# Patient Record
Sex: Male | Born: 1985
Health system: Southern US, Community
[De-identification: ages and names within clinical notes are randomized; demographics above are authoritative.]

## PROBLEM LIST (undated history)

## (undated) DIAGNOSIS — F1020 Alcohol dependence, uncomplicated: Secondary | ICD-10-CM

## (undated) DIAGNOSIS — Z720 Tobacco use: Secondary | ICD-10-CM

---

## 2009-01-27 ENCOUNTER — Emergency Department (HOSPITAL_COMMUNITY): Admission: EM | Admit: 2009-01-27 | Discharge: 2009-01-27 | Payer: Self-pay | Admitting: Emergency Medicine

## 2013-09-11 ENCOUNTER — Emergency Department (INDEPENDENT_AMBULATORY_CARE_PROVIDER_SITE_OTHER)
Admission: EM | Admit: 2013-09-11 | Discharge: 2013-09-11 | Disposition: A | Discharge status: Home / Self Care | Payer: BC Managed Care – PPO | Source: Home / Self Care | Attending: Family Medicine | Admitting: Family Medicine

## 2013-09-11 ENCOUNTER — Encounter (HOSPITAL_COMMUNITY): Payer: Self-pay | Admitting: Emergency Medicine

## 2013-09-11 DIAGNOSIS — W57XXXA Bitten or stung by nonvenomous insect and other nonvenomous arthropods, initial encounter: Principal | ICD-10-CM

## 2013-09-11 DIAGNOSIS — S90569A Insect bite (nonvenomous), unspecified ankle, initial encounter: Secondary | ICD-10-CM

## 2013-09-11 DIAGNOSIS — S80862A Insect bite (nonvenomous), left lower leg, initial encounter: Secondary | ICD-10-CM

## 2013-09-11 MED ORDER — FLUTICASONE PROPIONATE 0.05 % EX CREA: TOPICAL_CREAM | Freq: Two times a day (BID) | CUTANEOUS | Status: DC

## 2013-09-11 NOTE — ED Provider Notes (Signed)
CSN: [id number]     Arrival date & time 09/11/13  1231 History   First MD Initiated Contact with Patient 09/11/13 1333     Chief Complaint  Patient presents with  . Insect Bite   (Consider location/radiation/quality/duration/timing/severity/associated sxs/prior Treatment) Patient is a 28 y.o. male presenting with rash. The history is provided by the patient.  Rash Location:  Leg Leg rash location:  L upper leg Quality: redness   Quality: not blistering, not itchy, not painful and not swelling   Severity:  Mild Onset quality:  Sudden Duration:  3 days Progression:  Spreading Chronicity:  New Context: insect bite/sting   Relieved by:  None tried Worsened by:  Nothing tried Associated symptoms: no fever     History reviewed. No pertinent past medical history. History reviewed. No pertinent past surgical history. No family history on file. History  Substance Use Topics  . Smoking status: Current Every Day Smoker -- 2.00 packs/day    Types: Cigarettes  . Smokeless tobacco: Not on file  . Alcohol Use: Yes    Review of Systems  Constitutional: Negative for fever.  Musculoskeletal: Negative.   Skin: Positive for rash. Negative for wound.    Allergies  Review of patient's allergies indicates no known allergies.  Home Medications   Prior to Admission medications   Medication Sig Start Date End Date Taking? Authorizing Provider  fluticasone (CUTIVATE) 0.05 % cream Apply topically 2 (two) times daily. 09/11/13   Linna HoffJames D London Tarnowski, MD   BP 130/87  Pulse 88  Temp(Src) 98.2 F (36.8 C) (Oral)  Resp 16  SpO2 99% Physical Exam  Nursing note and vitals reviewed. Constitutional: He is oriented to person, place, and time. He appears well-developed and well-nourished.  Musculoskeletal: Normal range of motion.  Neurological: He is alert and oriented to person, place, and time.  Skin: Skin is warm and dry. Rash noted. There is erythema. No pallor.  irreg erythematous rash to thigh,  sl central induration, nontender.    ED Course  Procedures (including critical care time) Labs Review Labs Reviewed - No data to display  Imaging Review No results found.   MDM   1. Insect bite of leg, left, initial encounter        Linna HoffJames D Louana Fontenot, MD 09/11/13 [phone]

## 2013-09-11 NOTE — Discharge Instructions (Signed)
Use cream to rash as needed. Return as needed.

## 2013-09-11 NOTE — ED Notes (Signed)
Pt c/o poss insect bite to left inner thigh onset 3 days Sx include redness that's getting bigger Denies pain, fevers Alert w/no signs of acute distress.

## 2015-01-09 ENCOUNTER — Encounter (HOSPITAL_COMMUNITY): Payer: Self-pay | Admitting: *Deleted

## 2015-01-09 ENCOUNTER — Emergency Department (INDEPENDENT_AMBULATORY_CARE_PROVIDER_SITE_OTHER)
Admission: EM | Admit: 2015-01-09 | Discharge: 2015-01-09 | Disposition: A | Discharge status: Home / Self Care | Payer: Self-pay | Source: Home / Self Care | Attending: Family Medicine | Admitting: Family Medicine

## 2015-01-09 DIAGNOSIS — K529 Noninfective gastroenteritis and colitis, unspecified: Secondary | ICD-10-CM

## 2015-01-09 MED ORDER — ONDANSETRON HCL 4 MG PO TABS: 4.0000 mg | ORAL_TABLET | Freq: Four times a day (QID) | ORAL | Status: DC

## 2015-01-09 MED ORDER — ONDANSETRON 4 MG PO TBDP
ORAL_TABLET | ORAL | Status: AC
  Filled 2015-01-09: qty 1

## 2015-01-09 MED ORDER — ONDANSETRON 4 MG PO TBDP
4.0000 mg | ORAL_TABLET | Freq: Once | ORAL | Status: AC
  Administered 2015-01-09: 4 mg via ORAL

## 2015-01-09 NOTE — ED Provider Notes (Signed)
CSN: [id number]     Arrival date & time 01/09/15  1305 History   First MD Initiated Contact with Patient 01/09/15 1342     Chief Complaint  Patient presents with  . Emesis   (Consider location/radiation/quality/duration/timing/severity/associated sxs/prior Treatment) Patient is a 29 y.o. male presenting with vomiting. The history is provided by the patient.  Emesis Severity:  Moderate Duration:  4 hours Number of daily episodes:  10 Quality:  Stomach contents Progression:  Improving Chronicity:  New Context comment:  Ate at [employer] last eve after work and began vomiting this am on awakening. Relieved by:  None tried Worsened by:  Nothing tried Ineffective treatments:  Liquids Associated symptoms: no abdominal pain, no diarrhea and no fever   Risk factors: suspect food intake     History reviewed. No pertinent past medical history. History reviewed. No pertinent past surgical history. History reviewed. No pertinent family history. Social History  Substance Use Topics  . Smoking status: Current Every Day Smoker -- 2.00 packs/day    Types: Cigarettes  . Smokeless tobacco: None  . Alcohol Use: Yes    Review of Systems  Constitutional: Negative.   Gastrointestinal: Positive for nausea and vomiting. Negative for abdominal pain, diarrhea and constipation.  Skin: Negative.   All other systems reviewed and are negative.   Allergies  Review of patient's allergies indicates no known allergies.  Home Medications   Prior to Admission medications   Medication Sig Start Date End Date Taking? Authorizing Provider  fluticasone (CUTIVATE) 0.05 % cream Apply topically 2 (two) times daily. 09/11/13   Linna HoffJames D Martha Ellerby, MD  ondansetron (ZOFRAN) 4 MG tablet Take 1 tablet (4 mg total) by mouth every 6 (six) hours. Prn n/v. 01/09/15   Linna HoffJames D Noele Icenhour, MD   Meds Ordered and Administered this Visit   Medications  ondansetron (ZOFRAN-ODT) disintegrating tablet 4 mg (not administered)    BP  141/98 mmHg  Pulse 97  Temp(Src) 98.9 F (37.2 C) (Oral)  Resp 16  SpO2 98% No data found.   Physical Exam  Constitutional: He is oriented to person, place, and time. He appears well-developed and well-nourished. No distress.  HENT:  Head: Normocephalic.  Mouth/Throat: Oropharynx is clear and moist.  Neck: Normal range of motion. Neck supple.  Pulmonary/Chest: Effort normal and breath sounds normal.  Abdominal: Soft. Bowel sounds are normal. He exhibits no distension and no mass. There is no tenderness. There is no rebound and no guarding.  Lymphadenopathy:    He has no cervical adenopathy.  Neurological: He is alert and oriented to person, place, and time.  Skin: Skin is warm and dry.  Nursing note and vitals reviewed.   ED Course  Procedures (including critical care time)  Labs Review Labs Reviewed - No data to display  Imaging Review No results found.   Visual Acuity Review  Right Eye Distance:   Left Eye Distance:   Bilateral Distance:    Right Eye Near:   Left Eye Near:    Bilateral Near:         MDM   1. Gastroenteritis, acute        Linna HoffJames D Cabell Lazenby, MD 01/09/15 1432

## 2015-01-09 NOTE — ED Notes (Signed)
Pt  Reports   Symptoms     Of    Vomiting     Woke  Up  With  The   Symptoms   This  Am      No  Diarrhea      Pt    Reports  abd  Pain

## 2015-01-09 NOTE — Discharge Instructions (Signed)
Clear liquid , bland diet tonight as tolerated, advance on tues as improved, use medicine as needed, return or see your doctor if any problems. °

## 2018-08-10 ENCOUNTER — Other Ambulatory Visit: Payer: Self-pay

## 2018-08-10 ENCOUNTER — Emergency Department (HOSPITAL_COMMUNITY): Payer: Self-pay

## 2018-08-10 ENCOUNTER — Inpatient Hospital Stay (HOSPITAL_COMMUNITY)
Admission: EM | Admit: 2018-08-10 | Discharge: 2018-08-12 | DRG: 440 | Disposition: A | Discharge status: Home / Self Care | Payer: Self-pay | Attending: Internal Medicine | Admitting: Internal Medicine

## 2018-08-10 ENCOUNTER — Encounter (HOSPITAL_COMMUNITY): Payer: Self-pay | Admitting: Emergency Medicine

## 2018-08-10 DIAGNOSIS — Z720 Tobacco use: Secondary | ICD-10-CM

## 2018-08-10 DIAGNOSIS — Z7141 Alcohol abuse counseling and surveillance of alcoholic: Secondary | ICD-10-CM

## 2018-08-10 DIAGNOSIS — F1290 Cannabis use, unspecified, uncomplicated: Secondary | ICD-10-CM | POA: Diagnosis present

## 2018-08-10 DIAGNOSIS — Z716 Tobacco abuse counseling: Secondary | ICD-10-CM

## 2018-08-10 DIAGNOSIS — Z8249 Family history of ischemic heart disease and other diseases of the circulatory system: Secondary | ICD-10-CM

## 2018-08-10 DIAGNOSIS — K859 Acute pancreatitis without necrosis or infection, unspecified: Secondary | ICD-10-CM | POA: Diagnosis present

## 2018-08-10 DIAGNOSIS — R81 Glycosuria: Secondary | ICD-10-CM | POA: Diagnosis present

## 2018-08-10 DIAGNOSIS — Z79899 Other long term (current) drug therapy: Secondary | ICD-10-CM

## 2018-08-10 DIAGNOSIS — R739 Hyperglycemia, unspecified: Secondary | ICD-10-CM | POA: Diagnosis present

## 2018-08-10 DIAGNOSIS — F1190 Opioid use, unspecified, uncomplicated: Secondary | ICD-10-CM | POA: Diagnosis present

## 2018-08-10 DIAGNOSIS — F1020 Alcohol dependence, uncomplicated: Secondary | ICD-10-CM | POA: Diagnosis present

## 2018-08-10 DIAGNOSIS — Z20828 Contact with and (suspected) exposure to other viral communicable diseases: Secondary | ICD-10-CM | POA: Diagnosis present

## 2018-08-10 DIAGNOSIS — K852 Alcohol induced acute pancreatitis without necrosis or infection: Principal | ICD-10-CM

## 2018-08-10 HISTORY — DX: Tobacco use: Z72.0

## 2018-08-10 HISTORY — DX: Alcohol dependence, uncomplicated: F10.20

## 2018-08-10 LAB — URINALYSIS, ROUTINE W REFLEX MICROSCOPIC
Bilirubin Urine: NEGATIVE
Glucose, UA: 150 mg/dL — AB
Hgb urine dipstick: NEGATIVE
Ketones, ur: NEGATIVE mg/dL
Leukocytes,Ua: NEGATIVE
Nitrite: NEGATIVE
Protein, ur: NEGATIVE mg/dL
Specific Gravity, Urine: 1.011 (ref 1.005–1.030)
pH: 5 (ref 5.0–8.0)

## 2018-08-10 LAB — COMPREHENSIVE METABOLIC PANEL
ALT: 45 U/L — ABNORMAL HIGH (ref 0–44)
AST: 90 U/L — ABNORMAL HIGH (ref 15–41)
Albumin: 3.9 g/dL (ref 3.5–5.0)
Alkaline Phosphatase: 70 U/L (ref 38–126)
Anion gap: 15 (ref 5–15)
BUN: 7 mg/dL (ref 6–20)
CO2: 20 mmol/L — ABNORMAL LOW (ref 22–32)
Calcium: 9.1 mg/dL (ref 8.9–10.3)
Chloride: 104 mmol/L (ref 98–111)
Creatinine, Ser: 0.89 mg/dL (ref 0.61–1.24)
GFR calc Af Amer: >60 mL/min (ref >60)
GFR calc non Af Amer: >60 mL/min (ref >60)
Glucose, Bld: 144 mg/dL — ABNORMAL HIGH (ref 70–99)
Potassium: 3.5 mmol/L (ref 3.5–5.1)
Sodium: 139 mmol/L (ref 135–145)
Total Bilirubin: 0.6 mg/dL (ref 0.3–1.2)
Total Protein: 7.2 g/dL (ref 6.5–8.1)

## 2018-08-10 LAB — CBC
HCT: 46.6 % (ref 39.0–52.0)
Hemoglobin: 15.8 g/dL (ref 13.0–17.0)
MCH: 32 pg (ref 26.0–34.0)
MCHC: 33.9 g/dL (ref 30.0–36.0)
MCV: 94.3 fL (ref 80.0–100.0)
Platelets: 232 10*3/uL (ref 150–400)
RBC: 4.94 MIL/uL (ref 4.22–5.81)
RDW: 12.7 % (ref 11.5–15.5)
WBC: 12.3 10*3/uL — ABNORMAL HIGH (ref 4.0–10.5)
nRBC: 0 % (ref 0.0–0.2)

## 2018-08-10 LAB — PHOSPHORUS: Phosphorus: 3.7 mg/dL (ref 2.5–4.6)

## 2018-08-10 LAB — SARS CORONAVIRUS 2 BY RT PCR (HOSPITAL ORDER, PERFORMED IN ~~LOC~~ HOSPITAL LAB): SARS Coronavirus 2: NEGATIVE

## 2018-08-10 LAB — RAPID URINE DRUG SCREEN, HOSP PERFORMED
Amphetamines: NOT DETECTED
Barbiturates: NOT DETECTED
Benzodiazepines: NOT DETECTED
Cocaine: NOT DETECTED
Opiates: POSITIVE — AB
Tetrahydrocannabinol: POSITIVE — AB

## 2018-08-10 LAB — LIPASE, BLOOD: Lipase: 2804 U/L — ABNORMAL HIGH (ref 11–51)

## 2018-08-10 LAB — MAGNESIUM: Magnesium: 1.7 mg/dL (ref 1.7–2.4)

## 2018-08-10 MED ORDER — ONDANSETRON HCL 4 MG/2ML IJ SOLN
4.0000 mg | Freq: Once | INTRAMUSCULAR | Status: AC
  Administered 2018-08-10: 4 mg via INTRAVENOUS
  Filled 2018-08-10: qty 2

## 2018-08-10 MED ORDER — TEMAZEPAM 15 MG PO CAPS
30.0000 mg | ORAL_CAPSULE | Freq: Every day | ORAL | Status: DC
  Filled 2018-08-10: qty 2

## 2018-08-10 MED ORDER — ENOXAPARIN SODIUM 40 MG/0.4ML ~~LOC~~ SOLN: 40.0000 mg | SUBCUTANEOUS | Status: DC

## 2018-08-10 MED ORDER — LORAZEPAM 2 MG/ML IJ SOLN
1.0000 mg | Freq: Once | INTRAMUSCULAR | Status: AC
  Administered 2018-08-10: 1 mg via INTRAVENOUS
  Filled 2018-08-10: qty 1

## 2018-08-10 MED ORDER — POTASSIUM CHLORIDE IN NACL 20-0.9 MEQ/L-% IV SOLN
INTRAVENOUS | Status: DC
  Administered 2018-08-10 – 2018-08-12 (×5): via INTRAVENOUS
  Filled 2018-08-10 (×7): qty 1000

## 2018-08-10 MED ORDER — HYDROMORPHONE HCL 1 MG/ML IJ SOLN
1.0000 mg | INTRAMUSCULAR | Status: DC | PRN
  Administered 2018-08-10 – 2018-08-12 (×11): 1 mg via INTRAVENOUS
  Filled 2018-08-10 (×11): qty 1

## 2018-08-10 MED ORDER — ACETAMINOPHEN 325 MG PO TABS: 650.0000 mg | ORAL_TABLET | Freq: Four times a day (QID) | ORAL | Status: DC | PRN

## 2018-08-10 MED ORDER — HYDROMORPHONE HCL 1 MG/ML IJ SOLN
1.0000 mg | Freq: Once | INTRAMUSCULAR | Status: AC
  Administered 2018-08-10: 1 mg via INTRAVENOUS
  Filled 2018-08-10: qty 1

## 2018-08-10 MED ORDER — MAGNESIUM SULFATE 2 GM/50ML IV SOLN
2.0000 g | Freq: Once | INTRAVENOUS | Status: AC
  Administered 2018-08-10: 2 g via INTRAVENOUS
  Filled 2018-08-10: qty 50

## 2018-08-10 MED ORDER — LORAZEPAM 2 MG/ML IJ SOLN
2.0000 mg | INTRAMUSCULAR | Status: DC | PRN
  Administered 2018-08-10 – 2018-08-11 (×6): 2 mg via INTRAVENOUS
  Filled 2018-08-10 (×6): qty 1

## 2018-08-10 MED ORDER — ACETAMINOPHEN 650 MG RE SUPP: 650.0000 mg | Freq: Four times a day (QID) | RECTAL | Status: DC | PRN

## 2018-08-10 MED ORDER — IOHEXOL 300 MG/ML  SOLN
100.0000 mL | Freq: Once | INTRAMUSCULAR | Status: AC | PRN
  Administered 2018-08-10: 100 mL via INTRAVENOUS

## 2018-08-10 MED ORDER — FENTANYL CITRATE (PF) 100 MCG/2ML IJ SOLN
50.0000 ug | Freq: Once | INTRAMUSCULAR | Status: AC
  Administered 2018-08-10: 50 ug via INTRAVENOUS
  Filled 2018-08-10: qty 2

## 2018-08-10 MED ORDER — ONDANSETRON HCL 4 MG PO TABS: 4.0000 mg | ORAL_TABLET | Freq: Four times a day (QID) | ORAL | Status: DC | PRN

## 2018-08-10 MED ORDER — PANTOPRAZOLE SODIUM 40 MG IV SOLR
40.0000 mg | INTRAVENOUS | Status: DC
  Administered 2018-08-10 – 2018-08-12 (×3): 40 mg via INTRAVENOUS
  Filled 2018-08-10 (×3): qty 40

## 2018-08-10 MED ORDER — SODIUM CHLORIDE 0.9 % IV BOLUS
1000.0000 mL | Freq: Once | INTRAVENOUS | Status: AC
  Administered 2018-08-10: 1000 mL via INTRAVENOUS

## 2018-08-10 MED ORDER — ONDANSETRON HCL 4 MG/2ML IJ SOLN: 4.0000 mg | Freq: Four times a day (QID) | INTRAMUSCULAR | Status: DC | PRN

## 2018-08-10 MED ORDER — FAMOTIDINE IN NACL 20-0.9 MG/50ML-% IV SOLN
20.0000 mg | Freq: Two times a day (BID) | INTRAVENOUS | Status: DC
  Filled 2018-08-10 (×2): qty 50

## 2018-08-10 NOTE — ED Notes (Signed)
Isaac Alvarado, patient's mother wanted to leave contact number so that she can be updated on plan of care for patient  [phone]

## 2018-08-10 NOTE — ED Triage Notes (Addendum)
Patient presents diaphoretic c/o mid upper abdominal pain with sudden onset this am. Patient states one episode of emesis, reports a bad gag reflux. Patient pacing in room.

## 2018-08-10 NOTE — Progress Notes (Signed)
Isaac Alvarado [id number] Admission Data: 08/10/2018 5:22 PM Attending Provider: Bobette Mo, MD  VEL:[id number], No Pcp Per Consults/ Treatment Team:   Isaac Alvarado is a 33 y.o. male patient admitted from ED awake, alert  & orientated  X 3,  Full Code, VSS - Blood pressure (!) 156/111, pulse (!) 124, temperature 97.7 F (36.5 C), resp. rate 17, height 5\' 11"  (1.803 m), weight 70.3 kg, SpO2 98 %., O2    RA, no c/o shortness of breath, no c/o chest pain, no distress noted. Tele # 24 placed and pt is currently running:normal sinus rhythm, sinus tachycardia.   IV site WDL:  wrist left, condition patent and no redness with a transparent dsg that's clean dry and intact.  Allergies:  No Known Allergies   Past Medical History:  Diagnosis Date  . Alcohol dependence (HCC)   . Tobacco use     History:  obtained from chart review and the patient. Tobacco/alcohol: Patient stated smokes 2 pack per day. History of alcohol abuse. Patient stated last drinking yesterday 6 pack of beer.   Pt orientation to unit, room and routine. Information packet given to patient/family and safety video watched.  Admission INP armband ID verified with patient/family, and in place. SR up x 2, fall risk assessment complete with Patient and family verbalizing understanding of risks associated with falls. Pt verbalizes an understanding of how to use the call bell and to call for help before getting out of bed.  Skin, clean-dry- intact without evidence of bruising, or skin tears.   No evidence of skin break down noted on exam. no rashes, no ecchymoses, no petechiae, no nodules, no jaundice, no purpura, no wounds    Will cont to monitor and assist as needed.  Theodore Demark, RN 08/10/2018 5:22 PM

## 2018-08-10 NOTE — ED Notes (Signed)
ED TO INPATIENT HANDOFF REPORT  ED Nurse Name and Phone #: Florentina AddisonKatie [phone]  S Name/Age/Gender Isaac Alvarado 33 y.o. male Room/Bed: 025C/025C  Code Status   Code Status: Not on file  Home/SNF/Other Home Patient oriented to: self, place, time and situation Is this baseline? Yes   Triage Complete: Triage complete  Chief Complaint abd pain/nausea  Triage Note Patient presents diaphoretic c/o mid upper abdominal pain with sudden onset this am. Patient states one episode of emesis, reports a bad gag reflux. Patient pacing in room.   Allergies No Known Allergies  Level of Care/Admitting Diagnosis ED Disposition    ED Disposition Condition Comment   Admit  The patient appears reasonably stabilized for admission considering the current resources, flow, and capabilities available in the ED at this time, and I doubt any other [facility] requiring further screening and/or treatment in the ED prior to admission is  present.       B Medical/Surgery History History reviewed. No pertinent past medical history. History reviewed. No pertinent surgical history.   A IV Location/Drains/Wounds Patient Lines/Drains/Airways Status   Active Line/Drains/Airways    Name:   Placement date:   Placement time:   Site:   Days:   Peripheral IV 08/10/18 Left Wrist   08/10/18    0921    Wrist   less than 1          Intake/Output Last 24 hours  Intake/Output Summary (Last 24 hours) at 08/10/2018 1223 Last data filed at 08/10/2018 1215 Gross per 24 hour  Intake 1000 ml  Output -  Net 1000 ml    Labs/Imaging Results for orders placed or performed during the hospital encounter of 08/10/18 (from the past 48 hour(s))  Lipase, blood     Status: Abnormal   Collection Time: 08/10/18  9:05 AM  Result Value Ref Range   Lipase 2,804 (H) 11 - 51 U/L    Comment: RESULTS CONFIRMED BY MANUAL DILUTION Performed at [facility], [address] [place], [place] [address]   Comprehensive metabolic panel      Status: Abnormal   Collection Time: 08/10/18  9:05 AM  Result Value Ref Range   Sodium 139 135 - 145 mmol/L   Potassium 3.5 3.5 - 5.1 mmol/L   Chloride 104 98 - 111 mmol/L   CO2 20 (L) 22 - 32 mmol/L   Glucose, Bld 144 (H) 70 - 99 mg/dL   BUN 7 6 - 20 mg/dL   Creatinine, Ser 1.470.89 0.61 - 1.24 mg/dL   Calcium 9.1 8.9 - 82.910.3 mg/dL   Total Protein 7.2 6.5 - 8.1 g/dL   Albumin 3.9 3.5 - 5.0 g/dL   AST 90 (H) 15 - 41 U/L   ALT 45 (H) 0 - 44 U/L   Alkaline Phosphatase 70 38 - 126 U/L   Total Bilirubin 0.6 0.3 - 1.2 mg/dL   GFR calc non Af Amer >60 >60 mL/min   GFR calc Af Amer >60 >60 mL/min   Anion gap 15 5 - 15    Comment: Performed at [facility], [address] [place], [place] [address]  CBC     Status: Abnormal   Collection Time: 08/10/18  9:05 AM  Result Value Ref Range   WBC 12.3 (H) 4.0 - 10.5 K/uL   RBC 4.94 4.22 - 5.81 MIL/uL   Hemoglobin 15.8 13.0 - 17.0 g/dL   HCT 08.646.6 57.839.0 - 46.952.0 %   MCV 94.3 80.0 - 100.0 fL  MCH 32.0 26.0 - 34.0 pg   MCHC 33.9 30.0 - 36.0 g/dL   RDW 53.2 02.3 - 34.3 %   Platelets 232 150 - 400 K/uL   nRBC 0.0 0.0 - 0.2 %    Comment: Performed at [facility], [address], [place], [place] [address]  Urinalysis, Routine w reflex microscopic     Status: Abnormal   Collection Time: 08/10/18 10:25 AM  Result Value Ref Range   Color, Urine YELLOW YELLOW   APPearance CLEAR CLEAR   Specific Gravity, Urine 1.011 1.005 - 1.030   pH 5.0 5.0 - 8.0   Glucose, UA 150 (A) NEGATIVE mg/dL   Hgb urine dipstick NEGATIVE NEGATIVE   Bilirubin Urine NEGATIVE NEGATIVE   Ketones, ur NEGATIVE NEGATIVE mg/dL   Protein, ur NEGATIVE NEGATIVE mg/dL   Nitrite NEGATIVE NEGATIVE   Leukocytes,Ua NEGATIVE NEGATIVE    Comment: Performed at [facility], [address], [place], [place] [address]  Rapid urine drug screen (hospital performed)     Status: Abnormal   Collection Time: 08/10/18 10:25 AM  Result Value Ref Range   Opiates POSITIVE (A)  NONE DETECTED   Cocaine NONE DETECTED NONE DETECTED   Benzodiazepines NONE DETECTED NONE DETECTED   Amphetamines NONE DETECTED NONE DETECTED   Tetrahydrocannabinol POSITIVE (A) NONE DETECTED   Barbiturates NONE DETECTED NONE DETECTED    Comment: (NOTE) DRUG SCREEN FOR MEDICAL PURPOSES ONLY.  IF CONFIRMATION IS NEEDED FOR ANY PURPOSE, NOTIFY LAB WITHIN 5 DAYS. LOWEST DETECTABLE LIMITS FOR URINE DRUG SCREEN Drug Class                     Cutoff (ng/mL) Amphetamine and metabolites    1000 Barbiturate and metabolites    200 Benzodiazepine                 200 Tricyclics and metabolites     300 Opiates and metabolites        300 Cocaine and metabolites        300 THC                            50 Performed at [facility], [address], [place], [place] [address]    Ct Abdomen Pelvis W Contrast  Result Date: 08/10/2018 CLINICAL DATA:  Diffuse abdominal pain, nausea, unable to lie on back EXAM: CT ABDOMEN AND PELVIS WITH CONTRAST TECHNIQUE: Multidetector CT imaging of the abdomen and pelvis was performed using the standard protocol following bolus administration of intravenous contrast. CONTRAST:  OMNIPAQUE IOHEXOL 300 MG/ML  SOLN COMPARISON:  None. FINDINGS: Lower chest: No acute abnormality. Hepatobiliary: No focal liver abnormality is seen. No gallstones, gallbladder wall thickening, or biliary dilatation. Pancreas: There is severe, diffuse inflammatory fat stranding and fluid about the pancreas and adjacent retroperitoneum and bowel mesentery. No evidence of discrete fluid collection. No pancreatic ductal dilatation. Spleen: Normal in size without focal abnormality. Adrenals/Urinary Tract: Adrenal glands are unremarkable. Kidneys are normal, without renal calculi, focal lesion, or hydronephrosis. Bladder is unremarkable. Stomach/Bowel: Stomach is within normal limits. Appendix appears normal. No evidence of bowel wall thickening, distention, or inflammatory changes.  Vascular/Lymphatic: No significant vascular findings are present. No enlarged abdominal or pelvic lymph nodes. Reproductive: No mass or other abnormality. Other: No abdominal wall hernia or abnormality.  Minimal ascites. Musculoskeletal: No acute or significant osseous findings. IMPRESSION: 1. There is severe, diffuse inflammatory fat  stranding and fluid about the pancreas and adjacent retroperitoneum and bowel mesentery. Findings are consistent with acute pancreatitis. No evidence of discrete fluid collection. No pancreatic ductal dilatation. 2.  Minimal ascites. Electronically Signed   By: Lauralyn Primes M.D.   On: 08/10/2018 11:35   Dg Chest Port 1 View  Result Date: 08/10/2018 CLINICAL DATA:  Pain EXAM: PORTABLE CHEST 1 VIEW COMPARISON:  None. FINDINGS: Lungs are clear. Heart size and pulmonary vascularity are normal. No adenopathy. No pneumothorax. No bone lesions. IMPRESSION: No edema or consolidation. Electronically Signed   By: Bretta Bang III M.D.   On: 08/10/2018 10:00    Pending Labs Unresulted Labs (From admission, onward)    Start     Ordered   08/10/18 1155  SARS Coronavirus 2 (CEPHEID - Performed in [facility] hospital lab), [facility] Order  (Asymptomatic Patients Labs)  Once,   R    Question:  Rule Out  Answer:  Yes   08/10/18 1154          Vitals/Pain Today's Vitals   08/10/18 0928 08/10/18 1021 08/10/18 1026 08/10/18 1215  BP: (!) 161/113  (!) 160/105 (!) 149/95  Pulse: 89  90 95  Resp: 18  (!) 22 17  Temp:      TempSrc:      SpO2: 99%  99% 94%  Weight:      Height:      PainSc:  10-Worst pain ever  10-Worst pain ever    Isolation Precautions No active isolations  Medications Medications  sodium chloride 0.9 % bolus 1,000 mL (0 mLs Intravenous Stopped 08/10/18 1215)  HYDROmorphone (DILAUDID) injection 1 mg (1 mg Intravenous Given 08/10/18 0922)  ondansetron (ZOFRAN) injection 4 mg (4 mg Intravenous Given 08/10/18 0922)  iohexol (OMNIPAQUE) 300 MG/ML solution 100 mL  (100 mLs Intravenous Contrast Given 08/10/18 1106)  LORazepam (ATIVAN) injection 1 mg (1 mg Intravenous Given 08/10/18 1129)  fentaNYL (SUBLIMAZE) injection 50 mcg (50 mcg Intravenous Given 08/10/18 1213)    Mobility walks Low fall risk   Focused Assessments GI   R Recommendations: See Admitting Provider Note  Report given to:   Additional Notes:

## 2018-08-10 NOTE — ED Notes (Signed)
Walked patient to the bathroom patient did well 

## 2018-08-10 NOTE — ED Provider Notes (Signed)
[facility] EMERGENCY DEPARTMENT Provider Note   CSN: [id number] Arrival date & time: 08/10/18  0841    History   Chief Complaint Chief Complaint  Patient presents with   Abdominal Pain    HPI Isaac Alvarado is a 33 y.o. male.     33 year old male presents with complaint of epigastric abdominal pain onset 730 this morning.  Patient states that he woke up this morning without any pain, went to the bathroom and voided and then developed abdominal discomfort.  Patient went to the bathroom for a bowel movement hoping this would relieve his discomfort without success.  Patient states that every morning when he wakes up he coughs very hard and vomits and also gets very clammy when this happens.  Patient reports he had this today as well.  Abdominal pain is located the epigastric area described as cramping or having to multiple sit ups, is worse with taking a deep breath, does not radiate.  Denies history of stomach ulcers, denies any significant past medical history.  No prior abdominal surgeries.  No other complaints or concerns.     History reviewed. No pertinent past medical history.  Patient Active Problem List   Diagnosis Date Noted   Acute pancreatitis 08/10/2018    History reviewed. No pertinent surgical history.      Home Medications    Prior to Admission medications   Medication Sig Start Date End Date Taking? Authorizing Provider  diphenhydrAMINE (BENADRYL) 25 mg capsule Take 25 mg by mouth daily at 12 noon.   Yes [provider]  omeprazole (PRILOSEC OTC) 20 MG tablet Take 20 mg by mouth daily at 12 noon.   Yes [provider]  fluticasone (CUTIVATE) 0.05 % cream Apply topically 2 (two) times daily. Patient not taking: Reported on 08/10/2018 09/11/13   Billy Fischer, MD  ondansetron (ZOFRAN) 4 MG tablet Take 1 tablet (4 mg total) by mouth every 6 (six) hours. Prn n/v. Patient not taking: Reported on 08/10/2018 01/09/15   Billy Fischer,  MD    Family History History reviewed. No pertinent family history.  Social History Social History   Tobacco Use   Smoking status: Current Every Day Smoker    Packs/day: 2.00    Types: Cigarettes   Smokeless tobacco: Never Used  Substance Use Topics   Alcohol use: Yes   Drug use: Yes    Types: Marijuana     Allergies   Patient has no known allergies.   Review of Systems Review of Systems  Constitutional: Positive for diaphoresis. Negative for chills and fever.  Respiratory: Positive for cough.   Cardiovascular: Negative for chest pain.  Gastrointestinal: Positive for abdominal pain and vomiting. Negative for blood in stool, constipation, diarrhea and nausea.  Genitourinary: Negative for dysuria, frequency and urgency.  Musculoskeletal: Negative for arthralgias and myalgias.  Skin: Negative for rash and wound.  Allergic/Immunologic: Negative for immunocompromised state.  Neurological: Negative for dizziness and weakness.  Hematological: Negative for adenopathy. Does not bruise/bleed easily.  All other systems reviewed and are negative.    Physical Exam Updated Vital Signs BP (!) 149/95 (BP Location: Right Arm)    Pulse 95    Temp (!) 97.3 F (36.3 C) (Oral)    Resp 17    Ht 5' 11"  (1.803 m)    Wt 70.3 kg    SpO2 94%    BMI 21.62 kg/m   Physical Exam Vitals signs and nursing note reviewed.  Constitutional:  General: He is not in acute distress.    Appearance: He is well-developed. He is diaphoretic.     Comments: Appears uncomfortable   HENT:     Head: Normocephalic and atraumatic.  Cardiovascular:     Rate and Rhythm: Normal rate and regular rhythm.     Heart sounds: Normal heart sounds.  Pulmonary:     Effort: Pulmonary effort is normal.     Breath sounds: Normal breath sounds.  Abdominal:     General: Abdomen is flat.     Tenderness: There is abdominal tenderness in the epigastric area. There is guarding.     Hernia: No hernia is present.    Musculoskeletal:     Comments: DP and femoral pulses present and equal.  Skin:    General: Skin is warm.  Neurological:     Mental Status: He is alert and oriented to person, place, and time.  Psychiatric:        Behavior: Behavior normal.      ED Treatments / Results  Labs (all labs ordered are listed, but only abnormal results are displayed) Labs Reviewed  LIPASE, BLOOD - Abnormal; Notable for the following components:      Result Value   Lipase 2,804 (*)    All other components within normal limits  COMPREHENSIVE METABOLIC PANEL - Abnormal; Notable for the following components:   CO2 20 (*)    Glucose, Bld 144 (*)    AST 90 (*)    ALT 45 (*)    All other components within normal limits  CBC - Abnormal; Notable for the following components:   WBC 12.3 (*)    All other components within normal limits  URINALYSIS, ROUTINE W REFLEX MICROSCOPIC - Abnormal; Notable for the following components:   Glucose, UA 150 (*)    All other components within normal limits  RAPID URINE DRUG SCREEN, HOSP PERFORMED - Abnormal; Notable for the following components:   Opiates POSITIVE (*)    Tetrahydrocannabinol POSITIVE (*)    All other components within normal limits  SARS CORONAVIRUS 2 (HOSPITAL ORDER, [facility])    EKG None  Radiology Ct Abdomen Pelvis W Contrast  Result Date: 08/10/2018 CLINICAL DATA:  Diffuse abdominal pain, nausea, unable to lie on back EXAM: CT ABDOMEN AND PELVIS WITH CONTRAST TECHNIQUE: Multidetector CT imaging of the abdomen and pelvis was performed using the standard protocol following bolus administration of intravenous contrast. CONTRAST:  1108m OMNIPAQUE IOHEXOL 300 MG/ML  SOLN COMPARISON:  None. FINDINGS: Lower chest: No acute abnormality. Hepatobiliary: No focal liver abnormality is seen. No gallstones, gallbladder wall thickening, or biliary dilatation. Pancreas: There is severe, diffuse inflammatory fat stranding and fluid about  the pancreas and adjacent retroperitoneum and bowel mesentery. No evidence of discrete fluid collection. No pancreatic ductal dilatation. Spleen: Normal in size without focal abnormality. Adrenals/Urinary Tract: Adrenal glands are unremarkable. Kidneys are normal, without renal calculi, focal lesion, or hydronephrosis. Bladder is unremarkable. Stomach/Bowel: Stomach is within normal limits. Appendix appears normal. No evidence of bowel wall thickening, distention, or inflammatory changes. Vascular/Lymphatic: No significant vascular findings are present. No enlarged abdominal or pelvic lymph nodes. Reproductive: No mass or other abnormality. Other: No abdominal wall hernia or abnormality.  Minimal ascites. Musculoskeletal: No acute or significant osseous findings. IMPRESSION: 1. There is severe, diffuse inflammatory fat stranding and fluid about the pancreas and adjacent retroperitoneum and bowel mesentery. Findings are consistent with acute pancreatitis. No evidence of discrete fluid collection. No pancreatic ductal  dilatation. 2.  Minimal ascites. Electronically Signed   By: Eddie Candle M.D.   On: 08/10/2018 11:35   Dg Chest Port 1 View  Result Date: 08/10/2018 CLINICAL DATA:  Pain EXAM: PORTABLE CHEST 1 VIEW COMPARISON:  None. FINDINGS: Lungs are clear. Heart size and pulmonary vascularity are normal. No adenopathy. No pneumothorax. No bone lesions. IMPRESSION: No edema or consolidation. Electronically Signed   By: Lowella Grip III M.D.   On: 08/10/2018 10:00    Procedures Procedures (including critical care time)  Medications Ordered in ED Medications  sodium chloride 0.9 % bolus 1,000 mL (0 mLs Intravenous Stopped 08/10/18 1215)  HYDROmorphone (DILAUDID) injection 1 mg (1 mg Intravenous Given 08/10/18 0922)  ondansetron (ZOFRAN) injection 4 mg (4 mg Intravenous Given 08/10/18 0922)  iohexol (OMNIPAQUE) 300 MG/ML solution 100 mL (100 mLs Intravenous Contrast Given 08/10/18 1106)  LORazepam (ATIVAN)  injection 1 mg (1 mg Intravenous Given 08/10/18 1129)  fentaNYL (SUBLIMAZE) injection 50 mcg (50 mcg Intravenous Given 08/10/18 1213)     Initial Impression / Assessment and Plan / ED Course  I have reviewed the triage vital signs and the nursing notes.  Pertinent labs & imaging results that were available during my care of the patient were reviewed by me and considered in my medical decision making (see chart for details).  Clinical Course as of Aug 09 1232  Mon Aug 09, 3273  3315 33 year old male presents with complaint of sudden onset of severe epigastric abdominal pain this morning with vomiting and diaphoresis.  Exam patient appears very uncomfortable, unable to lie still, diaphoretic with epigastric abdominal tenderness.   Patient denies PMH, admits to drinking heavily for the past few days. Patient was given 1 of Dilaudid and 4 of Zofran, sent for CT scan however was unable to lie flat for his CT scan and was given Ativan.  Patient reports very slight improvement in his pain, no longer vomiting.  CT scan positive for pancreatitis without abscess.  Review of lab work, lipase 2804, WBC 12.3.  Urine drug screen positive for opiates and marijuana.  Urinalysis positive for glucose.  CMP with CO2 of 20, elevated AST and ALT at 90 and 45, alk phos normal, total bili normal. Case discussed with Dr. Johnney Killian, ER attending, agrees with plan of care- consult hospitalist for admission.   [LM]  1234 Hospitalist will consult for admission.    [LM]    Clinical Course User Index [LM] Tacy Learn, PA-C      Final Clinical Impressions(s) / ED Diagnoses   Final diagnoses:  Alcohol-induced acute pancreatitis, unspecified complication status    ED Discharge Orders    None       Jaycen, Vercher, PA-C 08/10/18 1234    Charlesetta Shanks, MD 08/17/18 1443

## 2018-08-10 NOTE — H&P (Signed)
History and Physical    Isaac Alvarado ZOX:[id number] DOB: 08-18-1985 DOA: 08/10/2018  PCP: Patient, No Pcp Per   Patient coming from: Home.  I have personally briefly reviewed patient's old medical records in [facility]  Chief Complaint: Abdominal pain.  HPI: Isaac Alvarado is a 33 y.o. male with medical history significant of alcohol dependence with previous admission to detox and tobacco abuse who is coming to the emergency department with complaints of progressively worse abdominal discomfort since yesterday evening, but he states that this morning the pain was so intense woke him up from sleep.  The pain is epigastric, sharp, 10 out of 10, radiated to the back associated with nausea and 2 episodes of emesis.  He denies diarrhea, constipation, melena or hematochezia.  No dysuria, frequency or hematuria.  He denies fever, chills, sore throat, dyspnea, chest pain, palpitations, dizziness, diaphoresis, PND, orthopnea or pitting edema of the lower extremities.  He denies polyuria, polydipsia, polyphagia or blurry vision.  He denies travel history or known exposure to COVID 19+ patients.  ED Course: Initial vital signs temperature 97.3 F, pulse 100, respirations 20, blood pressure 150/99 mmHg O2 sat 100% on room air.  The patient received hydromorphone 1 mg IVP x1, lorazepam 1 mg IVP x1, Zofran 4 mg IVP x1, fentanyl 50 mcg and the 1000 mL normal saline bolus.  Urinalysis showed glucosuria of 150 mg/dL, but was otherwise negative.  UDS was positive for opiates and THC.  White count 12.3, hemoglobin 15.8 g/dL and platelets 811.  CMP shows CO2 of 20 mmol/L, all other electrolytes and anion gap are within normal limits.  Glucose 144 mg/dL, normal renal function, normal total protein, albumin, alkaline phosphatase and total bilirubin.  However, AST was 90 and ALT was 45 units/L.  Lipase was 2804.  SARS nasal swab was negative.  Review of Systems: As per HPI otherwise 10 point review of systems negative.    Past Medical History:  Diagnosis Date   Alcohol dependence (HCC)    Tobacco use     History reviewed. No pertinent surgical history.   reports that he has been smoking cigarettes. He has been smoking about 2.00 packs per day. He has never used smokeless tobacco. He reports current alcohol use. He reports current drug use. Drug: Marijuana.  No Known Allergies  Family History  Problem Relation Age of Onset   Heart attack Father    Prior to Admission medications   Medication Sig Start Date End Date Taking? Authorizing Provider  diphenhydrAMINE (BENADRYL) 25 mg capsule Take 25 mg by mouth daily at 12 noon.   Yes [provider]  omeprazole (PRILOSEC OTC) 20 MG tablet Take 20 mg by mouth daily at 12 noon.   Yes [provider]  fluticasone (CUTIVATE) 0.05 % cream Apply topically 2 (two) times daily. Patient not taking: Reported on 08/10/2018 09/11/13   Linna Hoff, MD  ondansetron (ZOFRAN) 4 MG tablet Take 1 tablet (4 mg total) by mouth every 6 (six) hours. Prn n/v. Patient not taking: Reported on 08/10/2018 01/09/15   Linna Hoff, MD    Physical Exam: Vitals:   08/10/18 1026 08/10/18 1215 08/10/18 1223 08/10/18 1324  BP: (!) 160/105 (!) 149/95  (!) 156/111  Pulse: 90 95 92 93  Resp: (!) 22 17    Temp:    97.7 F (36.5 C)  TempSrc:      SpO2: 99% 94%  98%  Weight:      Height:  Constitutional: Looks acutely ill. Eyes: PERRL, lids and conjunctivae are mildly injected. ENMT: Mucous membranes are mildly dry. Posterior pharynx clear of any exudate or lesions. Neck: normal, supple, no masses, no thyromegaly Respiratory: Decreased breath sounds on bases, otherwise clear to auscultation bilaterally, no wheezing, no crackles. Normal respiratory effort. No accessory muscle use.  Cardiovascular: Regular rate and rhythm, no murmurs / rubs / gallops. No extremity edema. 2+ pedal pulses. No carotid bruits.  Abdomen: Positive for gastric tenderness, no masses  palpated. No hepatosplenomegaly. Bowel sounds positive.  Musculoskeletal: no clubbing / cyanosis. Good ROM, no contractures. Normal muscle tone.  Skin: no rashes, lesions, ulcers only limited rheumatological exam. Neurologic: CN 2-12 grossly intact. Sensation intact, DTR normal. Strength 5/5 in all 4.  Psychiatric: Normal judgment and insight. Alert and oriented x 3. Normal mood.   Labs on Admission: I have personally reviewed following labs and imaging studies  CBC: Recent Labs  Lab 08/10/18 0905  WBC 12.3*  HGB 15.8  HCT 46.6  MCV 94.3  PLT 232   Basic Metabolic Panel: Recent Labs  Lab 08/10/18 0905  NA 139  K 3.5  CL 104  CO2 20*  GLUCOSE 144*  BUN 7  CREATININE 0.89  CALCIUM 9.1   GFR: Estimated Creatinine Clearance: 120.7 mL/min (by C-G formula based on SCr of 0.89 mg/dL). Liver Function Tests: Recent Labs  Lab 08/10/18 0905  AST 90*  ALT 45*  ALKPHOS 70  BILITOT 0.6  PROT 7.2  ALBUMIN 3.9   Recent Labs  Lab 08/10/18 0905  LIPASE 2,804*   No results for input(s): AMMONIA in the last 168 hours. Coagulation Profile: No results for input(s): INR, PROTIME in the last 168 hours. Cardiac Enzymes: No results for input(s): CKTOTAL, CKMB, CKMBINDEX, TROPONINI in the last 168 hours. BNP (last 3 results) No results for input(s): PROBNP in the last 8760 hours. HbA1C: No results for input(s): HGBA1C in the last 72 hours. CBG: No results for input(s): GLUCAP in the last 168 hours. Lipid Profile: No results for input(s): CHOL, HDL, LDLCALC, TRIG, CHOLHDL, LDLDIRECT in the last 72 hours. Thyroid Function Tests: No results for input(s): TSH, T4TOTAL, FREET4, T3FREE, THYROIDAB in the last 72 hours. Anemia Panel: No results for input(s): VITAMINB12, FOLATE, FERRITIN, TIBC, IRON, RETICCTPCT in the last 72 hours. Urine analysis:    Component Value Date/Time   COLORURINE YELLOW 08/10/2018 1025   APPEARANCEUR CLEAR 08/10/2018 1025   LABSPEC 1.011 08/10/2018 1025     PHURINE 5.0 08/10/2018 1025   GLUCOSEU 150 (A) 08/10/2018 1025   HGBUR NEGATIVE 08/10/2018 1025   BILIRUBINUR NEGATIVE 08/10/2018 1025   KETONESUR NEGATIVE 08/10/2018 1025   PROTEINUR NEGATIVE 08/10/2018 1025   NITRITE NEGATIVE 08/10/2018 1025   LEUKOCYTESUR NEGATIVE 08/10/2018 1025    Radiological Exams on Admission: Ct Abdomen Pelvis W Contrast  Result Date: 08/10/2018 CLINICAL DATA:  Diffuse abdominal pain, nausea, unable to lie on back EXAM: CT ABDOMEN AND PELVIS WITH CONTRAST TECHNIQUE: Multidetector CT imaging of the abdomen and pelvis was performed using the standard protocol following bolus administration of intravenous contrast. CONTRAST:  OMNIPAQUE IOHEXOL 300 MG/ML  SOLN COMPARISON:  None. FINDINGS: Lower chest: No acute abnormality. Hepatobiliary: No focal liver abnormality is seen. No gallstones, gallbladder wall thickening, or biliary dilatation. Pancreas: There is severe, diffuse inflammatory fat stranding and fluid about the pancreas and adjacent retroperitoneum and bowel mesentery. No evidence of discrete fluid collection. No pancreatic ductal dilatation. Spleen: Normal in size without focal abnormality. Adrenals/Urinary Tract: Adrenal  glands are unremarkable. Kidneys are normal, without renal calculi, focal lesion, or hydronephrosis. Bladder is unremarkable. Stomach/Bowel: Stomach is within normal limits. Appendix appears normal. No evidence of bowel wall thickening, distention, or inflammatory changes. Vascular/Lymphatic: No significant vascular findings are present. No enlarged abdominal or pelvic lymph nodes. Reproductive: No mass or other abnormality. Other: No abdominal wall hernia or abnormality.  Minimal ascites. Musculoskeletal: No acute or significant osseous findings. IMPRESSION: 1. There is severe, diffuse inflammatory fat stranding and fluid about the pancreas and adjacent retroperitoneum and bowel mesentery. Findings are consistent with acute pancreatitis. No  evidence of discrete fluid collection. No pancreatic ductal dilatation. 2.  Minimal ascites. Electronically Signed   By: Lauralyn PrimesAlex  Bibbey M.D.   On: 08/10/2018 11:35   Dg Chest Port 1 View  Result Date: 08/10/2018 CLINICAL DATA:  Pain EXAM: PORTABLE CHEST 1 VIEW COMPARISON:  None. FINDINGS: Lungs are clear. Heart size and pulmonary vascularity are normal. No adenopathy. No pneumothorax. No bone lesions. IMPRESSION: No edema or consolidation. Electronically Signed   By: Bretta BangWilliam  Woodruff III M.D.   On: 08/10/2018 10:00    EKG: Independently reviewed.  Vent. rate 78 BPM PR interval * ms QRS duration 91 ms QT/QTc 355/405 ms P-R-T axes 77 51 75  Assessment/Plan Principal Problem:   Acute pancreatitis Admit to inpatient/progressive. Keep n.p.o. Continue IV fluids. Analgesics as needed. Antiemetics as needed. Follow-up CBC, CMP and lipase level in a.m.  Active Problems:   Alcohol dependence (HCC) CIWA protocol started. Magnesium has been supplemented. Daily MVI, folate thiamine. Monitor closely while having concomitant narcotic/benzo.    Tobacco use Nicotine replacement therapy ordered.    Hyperglycemia Also has glucosuria. Follow-up fasting glucose level in a.m. Check hemoglobin A1c.   DVT prophylaxis: Lovenox SQ Code Status: Full code. Family Communication: Disposition Plan: Admit for acute pancreatitis and alcohol dependence detox treatment. Consults called: Admission status: Inpatient/progressive unit.  The patient has being admitted to the progressive unit for closer monitoring since besides presenting with acute pancreatitis that is showing significant inflammation on imaging, mild leukocytosis, history of alcohol dependence (12 pack of beer daily) who will be requiring frequent doses of narcotics for pain, but will also be receiving benzodiazepines and needing close monitoring for alcohol withdrawal.  Expected length of stay will be least 48 hours, but expecting up to 96 to  120 hours.  Bobette Moavid Manuel Jalayne Ganesh MD [facility]  08/10/2018, 2:37 PM    This document was prepared using Dragon voice recognition software and may contain some unintended transcription errors. This document was prepared using Dragon voice recognition software and may contain some unintended transcription errors.

## 2018-08-10 NOTE — ED Notes (Signed)
[place], mom, [phone], please call with updates

## 2018-08-10 NOTE — ED Notes (Signed)
Patient repeatedly removing self from monitor. Patient extremely restless.

## 2018-08-11 DIAGNOSIS — R739 Hyperglycemia, unspecified: Secondary | ICD-10-CM

## 2018-08-11 DIAGNOSIS — F1020 Alcohol dependence, uncomplicated: Secondary | ICD-10-CM

## 2018-08-11 DIAGNOSIS — Z720 Tobacco use: Secondary | ICD-10-CM

## 2018-08-11 LAB — COMPREHENSIVE METABOLIC PANEL WITH GFR
ALT: 33 U/L (ref 0–44)
AST: 49 U/L — ABNORMAL HIGH (ref 15–41)
Albumin: 3.4 g/dL — ABNORMAL LOW (ref 3.5–5.0)
Alkaline Phosphatase: 65 U/L (ref 38–126)
Anion gap: 10 (ref 5–15)
BUN: 5 mg/dL — ABNORMAL LOW (ref 6–20)
CO2: 29 mmol/L (ref 22–32)
Calcium: 8.9 mg/dL (ref 8.9–10.3)
Chloride: 100 mmol/L (ref 98–111)
Creatinine, Ser: 0.88 mg/dL (ref 0.61–1.24)
GFR calc Af Amer: >60 mL/min
GFR calc non Af Amer: >60 mL/min
Glucose, Bld: 108 mg/dL — ABNORMAL HIGH (ref 70–99)
Potassium: 4.3 mmol/L (ref 3.5–5.1)
Sodium: 139 mmol/L (ref 135–145)
Total Bilirubin: 1 mg/dL (ref 0.3–1.2)
Total Protein: 6.4 g/dL — ABNORMAL LOW (ref 6.5–8.1)

## 2018-08-11 LAB — CBC WITH DIFFERENTIAL/PLATELET
Abs Immature Granulocytes: 0.04 10*3/uL (ref 0.00–0.07)
Basophils Absolute: 0 10*3/uL (ref 0.0–0.1)
Basophils Relative: 0 %
Eosinophils Absolute: 0 10*3/uL (ref 0.0–0.5)
Eosinophils Relative: 0 %
HCT: 48.5 % (ref 39.0–52.0)
Hemoglobin: 16.4 g/dL (ref 13.0–17.0)
Immature Granulocytes: 0 %
Lymphocytes Relative: 7 %
Lymphs Abs: 0.7 10*3/uL (ref 0.7–4.0)
MCH: 32.1 pg (ref 26.0–34.0)
MCHC: 33.8 g/dL (ref 30.0–36.0)
MCV: 94.9 fL (ref 80.0–100.0)
Monocytes Absolute: 0.7 10*3/uL (ref 0.1–1.0)
Monocytes Relative: 7 %
Neutro Abs: 8.6 10*3/uL — ABNORMAL HIGH (ref 1.7–7.7)
Neutrophils Relative %: 86 %
Platelets: 158 10*3/uL (ref 150–400)
RBC: 5.11 MIL/uL (ref 4.22–5.81)
RDW: 13 % (ref 11.5–15.5)
WBC: 10.1 10*3/uL (ref 4.0–10.5)
nRBC: 0 % (ref 0.0–0.2)

## 2018-08-11 LAB — LIPASE, BLOOD: Lipase: 905 U/L — ABNORMAL HIGH (ref 11–51)

## 2018-08-11 MED ORDER — LOPERAMIDE HCL 2 MG PO CAPS: 2.0000 mg | ORAL_CAPSULE | ORAL | Status: DC | PRN

## 2018-08-11 MED ORDER — CHLORDIAZEPOXIDE HCL 25 MG PO CAPS
25.0000 mg | ORAL_CAPSULE | Freq: Four times a day (QID) | ORAL | Status: AC
  Administered 2018-08-11 (×4): 25 mg via ORAL
  Filled 2018-08-11 (×4): qty 1

## 2018-08-11 MED ORDER — HYDROXYZINE HCL 25 MG PO TABS
25.0000 mg | ORAL_TABLET | Freq: Four times a day (QID) | ORAL | Status: DC | PRN
  Administered 2018-08-11 – 2018-08-12 (×2): 25 mg via ORAL
  Filled 2018-08-11 (×2): qty 1

## 2018-08-11 MED ORDER — CHLORDIAZEPOXIDE HCL 25 MG PO CAPS: 25.0000 mg | ORAL_CAPSULE | ORAL | Status: DC

## 2018-08-11 MED ORDER — CHLORDIAZEPOXIDE HCL 25 MG PO CAPS: 25.0000 mg | ORAL_CAPSULE | Freq: Every day | ORAL | Status: DC

## 2018-08-11 MED ORDER — CHLORDIAZEPOXIDE HCL 25 MG PO CAPS
25.0000 mg | ORAL_CAPSULE | Freq: Three times a day (TID) | ORAL | Status: DC
  Administered 2018-08-12 (×2): 25 mg via ORAL
  Filled 2018-08-11 (×2): qty 1

## 2018-08-11 MED ORDER — DOCUSATE SODIUM 100 MG PO CAPS
100.0000 mg | ORAL_CAPSULE | Freq: Every day | ORAL | Status: DC
  Administered 2018-08-11 – 2018-08-12 (×2): 100 mg via ORAL
  Filled 2018-08-11 (×2): qty 1

## 2018-08-11 MED ORDER — TEMAZEPAM 15 MG PO CAPS: 15.0000 mg | ORAL_CAPSULE | Freq: Every evening | ORAL | Status: DC | PRN

## 2018-08-11 MED ORDER — NICOTINE 21 MG/24HR TD PT24
21.0000 mg | MEDICATED_PATCH | Freq: Every day | TRANSDERMAL | Status: DC
  Administered 2018-08-11 – 2018-08-12 (×2): 21 mg via TRANSDERMAL
  Filled 2018-08-11 (×2): qty 1

## 2018-08-11 MED ORDER — CHLORDIAZEPOXIDE HCL 25 MG PO CAPS
25.0000 mg | ORAL_CAPSULE | Freq: Once | ORAL | Status: AC
  Administered 2018-08-11: 25 mg via ORAL
  Filled 2018-08-11: qty 1

## 2018-08-11 MED ORDER — ONDANSETRON 4 MG PO TBDP: 4.0000 mg | ORAL_TABLET | Freq: Four times a day (QID) | ORAL | Status: DC | PRN

## 2018-08-11 MED ORDER — HEPARIN SODIUM (PORCINE) 5000 UNIT/ML IJ SOLN
5000.0000 [IU] | Freq: Three times a day (TID) | INTRAMUSCULAR | Status: DC
  Administered 2018-08-11 – 2018-08-12 (×4): 5000 [IU] via SUBCUTANEOUS
  Filled 2018-08-11 (×4): qty 1

## 2018-08-11 MED ORDER — CHLORDIAZEPOXIDE HCL 25 MG PO CAPS: 25.0000 mg | ORAL_CAPSULE | Freq: Four times a day (QID) | ORAL | Status: DC | PRN

## 2018-08-11 MED ORDER — POLYETHYLENE GLYCOL 3350 17 G PO PACK: 17.0000 g | PACK | Freq: Every day | ORAL | Status: DC | PRN

## 2018-08-11 NOTE — Progress Notes (Signed)
PROGRESS NOTE    Isaac Alvarado  OMB:[id number] DOB: February 27, 1986 DOA: 08/10/2018 PCP: Patient, No Pcp Per     Brief Narrative:  33 year old male with past medical history of alcohol dependency and tobacco abuse; presented to the hospital secondary to epigastric pain, nausea and 2 episodes of emesis.  Patient found with acute alcoholic pancreatitis.  Assessment & Plan: 1-acute alcoholic pancreatitis -No vomiting -Still experiencing mild intermittent nausea and abdominal pain -Lipase trending down -Patient negative tried full liquid diet and slowly advance oral intake. -Continue IV fluids -Continue as needed antiemetics and analgesics. -Continue supportive care.  2-Alcohol dependence (HCC) -Cessation counseling has been provided -Patient interested in detox -Will use Librium protocol  3-Tobacco use -I have discussed tobacco cessation with the patient.  I have counseled the patient regarding the negative impacts of continued tobacco use including but not limited to lung cancer, COPD, and cardiovascular disease.  I have discussed alternatives to tobacco and modalities that may help facilitate tobacco cessation including but not limited to biofeedback, hypnosis, and medications.  Total time spent with tobacco counseling was 4 minutes -nicotine patch ordered   4-Hyperglycemia -no prior hx of DM -Most likely in the setting of acute pancreatitis. -A1C pending   DVT prophylaxis: Heparin Code Status: Full code Family Communication: No family at bedside. Disposition Plan: Slowly improving; having abdominal pain and some intermittent nausea.  Eager to have diet advanced to full liquid.  Lipase down.  Consultants:   None  Procedures:   See below for x-ray reports.  Antimicrobials:  Anti-infectives (From admission, onward)   None     Subjective: Afebrile, no chest pain, no vomiting.  Still complaining of abdominal discomfort and mild intermittent nausea.  Objective: Vitals:   08/11/18 0825 08/11/18 0825 08/11/18 1206 08/11/18 1628  BP: (!) 144/110 (!) 144/110 (!) 126/97 (!) 144/108  Pulse: (!) 130 (!) 130 (!) 115 (!) 116  Resp: 18  16 16   Temp: 97.9 F (36.6 C) 97.9 F (36.6 C) 98.4 F (36.9 C) 98 F (36.7 C)  TempSrc: Oral Oral Oral Oral  SpO2: 97% 97% 96% 98%  Weight:      Height:        Intake/Output Summary (Last 24 hours) at 08/11/2018 1707 Last data filed at 08/11/2018 0732 Gross per 24 hour  Intake 1510.21 ml  Output -  Net 1510.21 ml   Filed Weights   08/10/18 0852  Weight: 70.3 kg    Examination: General exam: Alert, awake, oriented x 3; no chest pain, no vomiting.  Still having some abdominal discomfort and nausea, but improving. Respiratory system: Clear to auscultation. Respiratory effort normal. Cardiovascular system:RRR. No murmurs, rubs, gallops. Gastrointestinal system: Abdomen is nondistended, soft and tender to palpation in epigastric area. No organomegaly or masses felt. Normal bowel sounds heard. Central nervous system: Alert and oriented. No focal neurological deficits. Extremities: No C/C/E, +pedal pulses Skin: No rashes, lesions or ulcers Psychiatry: Judgement and insight appear normal. Mood & affect appropriate.    Data Reviewed: I have personally reviewed following labs and imaging studies  CBC: Recent Labs  Lab 08/10/18 0905 08/11/18 0245  WBC 12.3* 10.1  NEUTROABS  --  8.6*  HGB 15.8 16.4  HCT 46.6 48.5  MCV 94.3 94.9  PLT 232 158   Basic Metabolic Panel: Recent Labs  Lab 08/10/18 0905 08/10/18 1355 08/11/18 0245  NA 139  --  139  K 3.5  --  4.3  CL 104  --  100  CO2 20*  --  29  GLUCOSE 144*  --  108*  BUN 7  --  5*  CREATININE 0.89  --  0.88  CALCIUM 9.1  --  8.9  MG  --  1.7  --   PHOS  --  3.7  --    GFR: Estimated Creatinine Clearance: 122 mL/min (by C-G formula based on SCr of 0.88 mg/dL).   Liver Function Tests: Recent Labs  Lab 08/10/18 0905 08/11/18 0245  AST 90* 49*  ALT 45* 33   ALKPHOS 70 65  BILITOT 0.6 1.0  PROT 7.2 6.4*  ALBUMIN 3.9 3.4*   Recent Labs  Lab 08/10/18 0905 08/11/18 0245  LIPASE 2,804* 905*   Urine analysis:    Component Value Date/Time   COLORURINE YELLOW 08/10/2018 1025   APPEARANCEUR CLEAR 08/10/2018 1025   LABSPEC 1.011 08/10/2018 1025   PHURINE 5.0 08/10/2018 1025   GLUCOSEU 150 (A) 08/10/2018 1025   HGBUR NEGATIVE 08/10/2018 1025   BILIRUBINUR NEGATIVE 08/10/2018 1025   KETONESUR NEGATIVE 08/10/2018 1025   PROTEINUR NEGATIVE 08/10/2018 1025   NITRITE NEGATIVE 08/10/2018 1025   LEUKOCYTESUR NEGATIVE 08/10/2018 1025    Recent Results (from the past 240 hour(s))  SARS Coronavirus 2 (CEPHEID - Performed in [facility]), Hosp Order     Status: None   Collection Time: 08/10/18 12:15 PM  Result Value Ref Range Status   SARS Coronavirus 2 NEGATIVE NEGATIVE Final    Comment: (NOTE) If result is NEGATIVE SARS-CoV-2 target nucleic acids are NOT DETECTED. The SARS-CoV-2 RNA is generally detectable in upper and lower  respiratory specimens during the acute phase of infection. The lowest  concentration of SARS-CoV-2 viral copies this assay can detect is 250  copies / mL. A negative result does not preclude SARS-CoV-2 infection  and should not be used as the sole basis for treatment or other  patient management decisions.  A negative result may occur with  improper specimen collection / handling, submission of specimen other  than nasopharyngeal swab, presence of viral mutation(s) within the  areas targeted by this assay, and inadequate number of viral copies  (<250 copies / mL). A negative result must be combined with clinical  observations, patient history, and epidemiological information. If result is POSITIVE SARS-CoV-2 target nucleic acids are DETECTED. The SARS-CoV-2 RNA is generally detectable in upper and lower  respiratory specimens dur ing the acute phase of infection.  Positive  results are indicative of  active infection with SARS-CoV-2.  Clinical  correlation with patient history and other diagnostic information is  necessary to determine patient infection status.  Positive results do  not rule out bacterial infection or co-infection with other viruses. If result is PRESUMPTIVE POSTIVE SARS-CoV-2 nucleic acids MAY BE PRESENT.   A presumptive positive result was obtained on the submitted specimen  and confirmed on repeat testing.  While 2019 novel coronavirus  (SARS-CoV-2) nucleic acids may be present in the submitted sample  additional confirmatory testing may be necessary for epidemiological  and / or clinical management purposes  to differentiate between  SARS-CoV-2 and other Sarbecovirus currently known to infect humans.  If clinically indicated additional testing with an alternate test  methodology [phone] is advised. The SARS-CoV-2 RNA is generally  detectable in upper and lower respiratory sp ecimens during the acute  phase of infection. The expected result is Negative. Fact Sheet for Patients:  BoilerBrush.com.cyhttps://www.fda.gov/media/136312/download Fact Sheet for Healthcare Providers: https://pope.com/https://www.fda.gov/media/136313/download This test is not yet approved or cleared by the [place] FDA and  has been authorized for detection and/or diagnosis of SARS-CoV-2 by FDA under an Emergency Use Authorization (EUA).  This EUA will remain in effect (meaning this test can be used) for the duration of the COVID-19 declaration under Section 564(b)(1) of the Act, 21 U.S.C. section 360bbb-3(b)(1), unless the authorization is terminated or revoked sooner. Performed at [facility], [address], [place], [place] [address]       Radiology Studies: Ct Abdomen Pelvis W Contrast  Result Date: 08/10/2018 CLINICAL DATA:  Diffuse abdominal pain, nausea, unable to lie on back EXAM: CT ABDOMEN AND PELVIS WITH CONTRAST TECHNIQUE: Multidetector CT imaging of the abdomen and pelvis was performed using  the standard protocol following bolus administration of intravenous contrast. CONTRAST:  OMNIPAQUE IOHEXOL 300 MG/ML  SOLN COMPARISON:  None. FINDINGS: Lower chest: No acute abnormality. Hepatobiliary: No focal liver abnormality is seen. No gallstones, gallbladder wall thickening, or biliary dilatation. Pancreas: There is severe, diffuse inflammatory fat stranding and fluid about the pancreas and adjacent retroperitoneum and bowel mesentery. No evidence of discrete fluid collection. No pancreatic ductal dilatation. Spleen: Normal in size without focal abnormality. Adrenals/Urinary Tract: Adrenal glands are unremarkable. Kidneys are normal, without renal calculi, focal lesion, or hydronephrosis. Bladder is unremarkable. Stomach/Bowel: Stomach is within normal limits. Appendix appears normal. No evidence of bowel wall thickening, distention, or inflammatory changes. Vascular/Lymphatic: No significant vascular findings are present. No enlarged abdominal or pelvic lymph nodes. Reproductive: No mass or other abnormality. Other: No abdominal wall hernia or abnormality.  Minimal ascites. Musculoskeletal: No acute or significant osseous findings. IMPRESSION: 1. There is severe, diffuse inflammatory fat stranding and fluid about the pancreas and adjacent retroperitoneum and bowel mesentery. Findings are consistent with acute pancreatitis. No evidence of discrete fluid collection. No pancreatic ductal dilatation. 2.  Minimal ascites. Electronically Signed   By: Lauralyn Primes M.D.   On: 08/10/2018 11:35   Dg Chest Port 1 View  Result Date: 08/10/2018 CLINICAL DATA:  Pain EXAM: PORTABLE CHEST 1 VIEW COMPARISON:  None. FINDINGS: Lungs are clear. Heart size and pulmonary vascularity are normal. No adenopathy. No pneumothorax. No bone lesions. IMPRESSION: No edema or consolidation. Electronically Signed   By: Bretta Bang III M.D.   On: 08/10/2018 10:00    Scheduled Meds: . chlordiazePOXIDE  25 mg Oral QID    Followed by  . [START ON 08/12/2018] chlordiazePOXIDE  25 mg Oral TID   Followed by  . [START ON 08/13/2018] chlordiazePOXIDE  25 mg Oral BH-qamhs   Followed by  . [START ON 08/14/2018] chlordiazePOXIDE  25 mg Oral Daily  . docusate sodium  100 mg Oral Daily  . pantoprazole (PROTONIX) IV  40 mg Intravenous Q24H   Continuous Infusions: . 0.9 % NaCl with KCl 20 mEq / L 125 mL/hr at 08/11/18 1053     LOS: 1 day    Time spent: 30 minutes.     Vassie Loll, MD [facility] Pager [phone]   08/11/2018, 5:07 PM

## 2018-08-12 LAB — CBC WITH DIFFERENTIAL/PLATELET
Abs Immature Granulocytes: 0.03 10*3/uL (ref 0.00–0.07)
Basophils Absolute: 0 10*3/uL (ref 0.0–0.1)
Basophils Relative: 0 %
Eosinophils Absolute: 0.2 10*3/uL (ref 0.0–0.5)
Eosinophils Relative: 2 %
HCT: 41.2 % (ref 39.0–52.0)
Hemoglobin: 13.9 g/dL (ref 13.0–17.0)
Immature Granulocytes: 0 %
Lymphocytes Relative: 14 %
Lymphs Abs: 1.4 10*3/uL (ref 0.7–4.0)
MCH: 32.3 pg (ref 26.0–34.0)
MCHC: 33.7 g/dL (ref 30.0–36.0)
MCV: 95.6 fL (ref 80.0–100.0)
Monocytes Absolute: 0.7 10*3/uL (ref 0.1–1.0)
Monocytes Relative: 7 %
Neutro Abs: 7.8 10*3/uL — ABNORMAL HIGH (ref 1.7–7.7)
Neutrophils Relative %: 77 %
Platelets: 129 10*3/uL — ABNORMAL LOW (ref 150–400)
RBC: 4.31 MIL/uL (ref 4.22–5.81)
RDW: 12.9 % (ref 11.5–15.5)
WBC: 10.1 10*3/uL (ref 4.0–10.5)
nRBC: 0 % (ref 0.0–0.2)

## 2018-08-12 LAB — BASIC METABOLIC PANEL
Anion gap: 11 (ref 5–15)
BUN: <5 mg/dL — ABNORMAL LOW (ref 6–20)
CO2: 26 mmol/L (ref 22–32)
Calcium: 8.2 mg/dL — ABNORMAL LOW (ref 8.9–10.3)
Chloride: 97 mmol/L — ABNORMAL LOW (ref 98–111)
Creatinine, Ser: 0.96 mg/dL (ref 0.61–1.24)
GFR calc Af Amer: >60 mL/min (ref >60)
GFR calc non Af Amer: >60 mL/min (ref >60)
Glucose, Bld: 81 mg/dL (ref 70–99)
Potassium: 3.5 mmol/L (ref 3.5–5.1)
Sodium: 134 mmol/L — ABNORMAL LOW (ref 135–145)

## 2018-08-12 LAB — HEMOGLOBIN A1C
Hgb A1c MFr Bld: 4.9 % (ref 4.8–5.6)
Mean Plasma Glucose: 93.93 mg/dL

## 2018-08-12 LAB — LIPASE, BLOOD: Lipase: 564 U/L — ABNORMAL HIGH (ref 11–51)

## 2018-08-12 MED ORDER — METOPROLOL TARTRATE 25 MG PO TABS: 25.0000 mg | ORAL_TABLET | Freq: Two times a day (BID) | ORAL | 0 refills | Status: DC

## 2018-08-12 MED ORDER — IBUPROFEN 800 MG PO TABS: 800.0000 mg | ORAL_TABLET | Freq: Three times a day (TID) | ORAL | 0 refills | Status: AC | PRN

## 2018-08-12 MED ORDER — METOPROLOL TARTRATE 25 MG PO TABS
25.0000 mg | ORAL_TABLET | Freq: Two times a day (BID) | ORAL | Status: DC
  Administered 2018-08-12: 25 mg via ORAL
  Filled 2018-08-12: qty 1

## 2018-08-12 NOTE — Discharge Summary (Signed)
Physician Discharge Summary  Isaac Alvarado ION:[id number] DOB: March 04, 1986 DOA: 08/10/2018  PCP: Patient, No Pcp Per  Admit date: 08/10/2018 Discharge date: 08/12/2018  Admitted From: Home Disposition:  Home  Recommendations for Outpatient Follow-up:  1. Follow up with PCP in 1-2 weeks  Discharge Condition:Improved CODE STATUS:Full Diet recommendation: Soft, advance as tolerated   Brief/Interim Summary: 33 year old male with past medical history of alcohol dependency and tobacco abuse; presented to the hospital secondary to epigastric pain, nausea and 2 episodes of emesis. Patient found with acute alcoholic pancreatitis.  Discharge Diagnoses:  Principal Problem:   Acute pancreatitis Active Problems:   Alcohol dependence (HCC)   Tobacco use   Hyperglycemia   1-acutealcoholicpancreatitis -No vomiting -On re-evaluation, patient reports feeling much better, eager to go home -Lipase trending down -Continue as needed antiemetics and analgesics. -Advance diet as tolerated  2-Alcohol dependence (HCC) -Cessation counseling has been provided -Patient interested in detox, recommend ETOH treatment program as outpatient  3-Tobacco use -Tobacco cessation was done earlier -Continued on nicotine patch while in hospital  4-Hyperglycemia -no prior hx of DM -Glucose trends stable at this time -A1C of 4.9   Discharge Instructions   Allergies as of 08/12/2018   No Known Allergies     Medication List    STOP taking these medications   fluticasone 0.05 % cream Commonly known as:  Cutivate   ondansetron 4 MG tablet Commonly known as:  ZOFRAN     TAKE these medications   diphenhydrAMINE 25 mg capsule Commonly known as:  BENADRYL Take 25 mg by mouth daily at 12 noon.   ibuprofen 800 MG tablet Commonly known as:  ADVIL Take 1 tablet (800 mg total) by mouth every 8 (eight) hours as needed.   metoprolol tartrate 25 MG tablet Commonly known as:  LOPRESSOR Take 1 tablet (25  mg total) by mouth 2 (two) times daily for 30 days.   PriLOSEC OTC 20 MG tablet Generic drug:  omeprazole Take 20 mg by mouth daily at 12 noon.      Follow-up Information     [facility] Follow up on 08/20/2018.   Why:  2:50 pm with Georgian Co NP Contact information: [address] [place] [address] [phone]         No Known Allergies  Procedures/Studies: Ct Abdomen Pelvis W Contrast  Result Date: 08/10/2018 CLINICAL DATA:  Diffuse abdominal pain, nausea, unable to lie on back EXAM: CT ABDOMEN AND PELVIS WITH CONTRAST TECHNIQUE: Multidetector CT imaging of the abdomen and pelvis was performed using the standard protocol following bolus administration of intravenous contrast. CONTRAST:  OMNIPAQUE IOHEXOL 300 MG/ML  SOLN COMPARISON:  None. FINDINGS: Lower chest: No acute abnormality. Hepatobiliary: No focal liver abnormality is seen. No gallstones, gallbladder wall thickening, or biliary dilatation. Pancreas: There is severe, diffuse inflammatory fat stranding and fluid about the pancreas and adjacent retroperitoneum and bowel mesentery. No evidence of discrete fluid collection. No pancreatic ductal dilatation. Spleen: Normal in size without focal abnormality. Adrenals/Urinary Tract: Adrenal glands are unremarkable. Kidneys are normal, without renal calculi, focal lesion, or hydronephrosis. Bladder is unremarkable. Stomach/Bowel: Stomach is within normal limits. Appendix appears normal. No evidence of bowel wall thickening, distention, or inflammatory changes. Vascular/Lymphatic: No significant vascular findings are present. No enlarged abdominal or pelvic lymph nodes. Reproductive: No mass or other abnormality. Other: No abdominal wall hernia or abnormality.  Minimal ascites. Musculoskeletal: No acute or significant osseous findings. IMPRESSION: 1. There is severe, diffuse inflammatory fat  stranding and fluid about the pancreas  and adjacent retroperitoneum and bowel mesentery. Findings are consistent with acute pancreatitis. No evidence of discrete fluid collection. No pancreatic ductal dilatation. 2.  Minimal ascites. Electronically Signed   By: Lauralyn PrimesAlex  Bibbey M.D.   On: 08/10/2018 11:35   Dg Chest Port 1 View  Result Date: 08/10/2018 CLINICAL DATA:  Pain EXAM: PORTABLE CHEST 1 VIEW COMPARISON:  None. FINDINGS: Lungs are clear. Heart size and pulmonary vascularity are normal. No adenopathy. No pneumothorax. No bone lesions. IMPRESSION: No edema or consolidation. Electronically Signed   By: Bretta BangWilliam  Woodruff III M.D.   On: 08/10/2018 10:00     Subjective: Eager to go home  Discharge Exam: Vitals:   08/12/18 0501 08/12/18 1453  BP: 139/90 126/88  Pulse: (!) 110 93  Resp: 16 18  Temp: 98.6 F (37 C)   SpO2: 98% 99%   Vitals:   08/11/18 1628 08/11/18 2127 08/12/18 0501 08/12/18 1453  BP: (!) 144/108 (!) 138/105 139/90 126/88  Pulse: (!) 116 (!) 116 (!) 110 93  Resp: 16 16 16 18   Temp: 98 F (36.7 C) 98.3 F (36.8 C) 98.6 F (37 C)   TempSrc: Oral     SpO2: 98% 98% 98% 99%  Weight:      Height:        General: Pt is alert, awake, not in acute distress Cardiovascular: RRR, S1/S2 +, no rubs, no gallops Respiratory: CTA bilaterally, no wheezing, no rhonchi Abdominal: Soft, NT, ND, bowel sounds + Extremities: no edema, no cyanosis   The results of significant diagnostics from this hospitalization (including imaging, microbiology, ancillary and laboratory) are listed below for reference.     Microbiology: Recent Results (from the past 240 hour(s))  SARS Coronavirus 2 (CEPHEID - Performed in [facility] hospital lab), Hosp Order     Status: None   Collection Time: 08/10/18 12:15 PM  Result Value Ref Range Status   SARS Coronavirus 2 NEGATIVE NEGATIVE Final    Comment: (NOTE) If result is NEGATIVE SARS-CoV-2 target nucleic acids are NOT DETECTED. The SARS-CoV-2 RNA is generally detectable in upper  and lower  respiratory specimens during the acute phase of infection. The lowest  concentration of SARS-CoV-2 viral copies this assay can detect is 250  copies / mL. A negative result does not preclude SARS-CoV-2 infection  and should not be used as the sole basis for treatment or other  patient management decisions.  A negative result may occur with  improper specimen collection / handling, submission of specimen other  than nasopharyngeal swab, presence of viral mutation(s) within the  areas targeted by this assay, and inadequate number of viral copies  (<250 copies / mL). A negative result must be combined with clinical  observations, patient history, and epidemiological information. If result is POSITIVE SARS-CoV-2 target nucleic acids are DETECTED. The SARS-CoV-2 RNA is generally detectable in upper and lower  respiratory specimens dur ing the acute phase of infection.  Positive  results are indicative of active infection with SARS-CoV-2.  Clinical  correlation with patient history and other diagnostic information is  necessary to determine patient infection status.  Positive results do  not rule out bacterial infection or co-infection with other viruses. If result is PRESUMPTIVE POSTIVE SARS-CoV-2 nucleic acids MAY BE PRESENT.   A presumptive positive result was obtained on the submitted specimen  and confirmed on repeat testing.  While 2019 novel coronavirus  (SARS-CoV-2) nucleic acids may be present in the submitted sample  additional  confirmatory testing may be necessary for epidemiological  and / or clinical management purposes  to differentiate between  SARS-CoV-2 and other Sarbecovirus currently known to infect humans.  If clinically indicated additional testing with an alternate test  methodology [phone]) is advised. The SARS-CoV-2 RNA is generally  detectable in upper and lower respiratory sp ecimens during the acute  phase of infection. The expected result is  Negative. Fact Sheet for Patients:  BoilerBrush.com.cy Fact Sheet for Healthcare Providers: https://pope.com/ This test is not yet approved or cleared by the [place] FDA and has been authorized for detection and/or diagnosis of SARS-CoV-2 by FDA under an Emergency Use Authorization (EUA).  This EUA will remain in effect (meaning this test can be used) for the duration of the COVID-19 declaration under Section 564(b)(1) of the Act, 21 U.S.C. section 360bbb-3(b)(1), unless the authorization is terminated or revoked sooner. Performed at [facility], [address] [place], [place] [address]      Labs: BNP (last 3 results) No results for input(s): BNP in the last 8760 hours. Basic Metabolic Panel: Recent Labs  Lab 08/10/18 0905 08/10/18 1355 08/11/18 0245 08/12/18 0307  NA 139  --  139 134*  K 3.5  --  4.3 3.5  CL 104  --  100 97*  CO2 20*  --  29 26  GLUCOSE 144*  --  108* 81  BUN 7  --  5* <5*  CREATININE 0.89  --  0.88 0.96  CALCIUM 9.1  --  8.9 8.2*  MG  --  1.7  --   --   PHOS  --  3.7  --   --    Liver Function Tests: Recent Labs  Lab 08/10/18 0905 08/11/18 0245  AST 90* 49*  ALT 45* 33  ALKPHOS 70 65  BILITOT 0.6 1.0  PROT 7.2 6.4*  ALBUMIN 3.9 3.4*   Recent Labs  Lab 08/10/18 0905 08/11/18 0245 08/12/18 0307  LIPASE 2,804* 905* 564*   No results for input(s): AMMONIA in the last 168 hours. CBC: Recent Labs  Lab 08/10/18 0905 08/11/18 0245 08/12/18 0307  WBC 12.3* 10.1 10.1  NEUTROABS  --  8.6* 7.8*  HGB 15.8 16.4 13.9  HCT 46.6 48.5 41.2  MCV 94.3 94.9 95.6  PLT 232 158 129*   Cardiac Enzymes: No results for input(s): CKTOTAL, CKMB, CKMBINDEX, TROPONINI in the last 168 hours. BNP: Invalid input(s): POCBNP CBG: No results for input(s): GLUCAP in the last 168 hours. D-Dimer No results for input(s): DDIMER in the last 72 hours. Hgb A1c Recent Labs    08/12/18 0307  HGBA1C  4.9   Lipid Profile No results for input(s): CHOL, HDL, LDLCALC, TRIG, CHOLHDL, LDLDIRECT in the last 72 hours. Thyroid function studies No results for input(s): TSH, T4TOTAL, T3FREE, THYROIDAB in the last 72 hours.  Invalid input(s): FREET3 Anemia work up No results for input(s): VITAMINB12, FOLATE, FERRITIN, TIBC, IRON, RETICCTPCT in the last 72 hours. Urinalysis    Component Value Date/Time   COLORURINE YELLOW 08/10/2018 1025   APPEARANCEUR CLEAR 08/10/2018 1025   LABSPEC 1.011 08/10/2018 1025   PHURINE 5.0 08/10/2018 1025   GLUCOSEU 150 (A) 08/10/2018 1025   HGBUR NEGATIVE 08/10/2018 1025   BILIRUBINUR NEGATIVE 08/10/2018 1025   KETONESUR NEGATIVE 08/10/2018 1025   PROTEINUR NEGATIVE 08/10/2018 1025   NITRITE NEGATIVE 08/10/2018 1025   LEUKOCYTESUR NEGATIVE 08/10/2018 1025   Sepsis Labs Invalid input(s): PROCALCITONIN,  WBC,  LACTICIDVEN Microbiology Recent Results (from the past  240 hour(s))  SARS Coronavirus 2 (CEPHEID - Performed in [facility] hospital lab), Hosp Order     Status: None   Collection Time: 08/10/18 12:15 PM  Result Value Ref Range Status   SARS Coronavirus 2 NEGATIVE NEGATIVE Final    Comment: (NOTE) If result is NEGATIVE SARS-CoV-2 target nucleic acids are NOT DETECTED. The SARS-CoV-2 RNA is generally detectable in upper and lower  respiratory specimens during the acute phase of infection. The lowest  concentration of SARS-CoV-2 viral copies this assay can detect is 250  copies / mL. A negative result does not preclude SARS-CoV-2 infection  and should not be used as the sole basis for treatment or other  patient management decisions.  A negative result may occur with  improper specimen collection / handling, submission of specimen other  than nasopharyngeal swab, presence of viral mutation(s) within the  areas targeted by this assay, and inadequate number of viral copies  (<250 copies / mL). A negative result must be combined with clinical   observations, patient history, and epidemiological information. If result is POSITIVE SARS-CoV-2 target nucleic acids are DETECTED. The SARS-CoV-2 RNA is generally detectable in upper and lower  respiratory specimens dur ing the acute phase of infection.  Positive  results are indicative of active infection with SARS-CoV-2.  Clinical  correlation with patient history and other diagnostic information is  necessary to determine patient infection status.  Positive results do  not rule out bacterial infection or co-infection with other viruses. If result is PRESUMPTIVE POSTIVE SARS-CoV-2 nucleic acids MAY BE PRESENT.   A presumptive positive result was obtained on the submitted specimen  and confirmed on repeat testing.  While 2019 novel coronavirus  (SARS-CoV-2) nucleic acids may be present in the submitted sample  additional confirmatory testing may be necessary for epidemiological  and / or clinical management purposes  to differentiate between  SARS-CoV-2 and other Sarbecovirus currently known to infect humans.  If clinically indicated additional testing with an alternate test  methodology [phone] is advised. The SARS-CoV-2 RNA is generally  detectable in upper and lower respiratory sp ecimens during the acute  phase of infection. The expected result is Negative. Fact Sheet for Patients:  BoilerBrush.com.cy Fact Sheet for Healthcare Providers: https://pope.com/ This test is not yet approved or cleared by the [place] FDA and has been authorized for detection and/or diagnosis of SARS-CoV-2 by FDA under an Emergency Use Authorization (EUA).  This EUA will remain in effect (meaning this test can be used) for the duration of the COVID-19 declaration under Section 564(b)(1) of the Act, 21 U.S.C. section 360bbb-3(b)(1), unless the authorization is terminated or revoked sooner. Performed at [facility], [address]  [place], [place] [address]    Time spent:  SIGNED:   Rickey Barbara, MD  [facility] 08/12/2018, 5:20 PM  If 7PM-7AM, please contact night-coverage

## 2018-08-12 NOTE — Progress Notes (Signed)
Pt given discharge instructions, prescriptions, and care notes. Pt verbalized understanding AEB no further questions or concerns at this time. IV was discontinued, no redness, pain, or swelling noted at this time. Telemetry discontinued and Centralized Telemetry was notified. Pt left the floor with staff in stable condition. 

## 2018-08-12 NOTE — Progress Notes (Signed)
PROGRESS NOTE    Isaac Alvarado  ZOX:[id number] DOB: 1985/11/25 DOA: 08/10/2018 PCP: Patient, No Pcp Per    Brief Narrative:  33 year old male with past medical history of alcohol dependency and tobacco abuse; presented to the hospital secondary to epigastric pain, nausea and 2 episodes of emesis.  Patient found with acute alcoholic pancreatitis.  Assessment & Plan:   Principal Problem:   Acute pancreatitis Active Problems:   Alcohol dependence (HCC)   Tobacco use   Hyperglycemia  1-acute alcoholic pancreatitis -No vomiting -Continues with abd pain this afternoon, rating as 7/10 -Lipase trending down -Continue IV fluids -Continue as needed antiemetics and analgesics. -Will advance diet to soft today  2-Alcohol dependence (HCC) -Cessation counseling has been provided -Patient interested in detox -Continued on librium per protocol  3-Tobacco use -Tobacco cessation was done earlier -Will continue nicotine patch   4-Hyperglycemia -no prior hx of DM -Glucose trends stable at this time -A1C of 4.9  DVT prophylaxis: Heparin subQ Code Status: Full Family Communication: Pt in room, family not at bedside Disposition Plan: Possible d/c home in 24hrs if tolerates diet reliably  Consultants:     Procedures:     Antimicrobials: Anti-infectives (From admission, onward)   None       Subjective: Complaining of continued 7/10 abd pain, eager to advance diet this AM  Objective: Vitals:   08/11/18 1628 08/11/18 2127 08/12/18 0501 08/12/18 1453  BP: (!) 144/108 (!) 138/105 139/90 126/88  Pulse: (!) 116 (!) 116 (!) 110 93  Resp: Temp: 98 F (36.7 C) 98.3 F (36.8 C) 98.6 F (37 C)   TempSrc: Oral     SpO2: 98% 98% 98% 99%  Weight:      Height:        Intake/Output Summary (Last 24 hours) at 08/12/2018 1523 Last data filed at 08/12/2018 0524 Gross per 24 hour  Intake 2770.11 ml  Output -  Net 2770.11 ml   Filed Weights   08/10/18 0852   Weight: 70.3 kg    Examination:  General exam: Appears calm and comfortable  Respiratory system: Clear to auscultation. Respiratory effort normal. Cardiovascular system: S1 & S2 heard, RRR Gastrointestinal system: Pos BS, nondistended, generally tender Central nervous system: Alert and oriented. No focal neurological deficits. Extremities: Symmetric 5 x 5 power. Skin: No rashes, lesions Psychiatry: Judgement and insight appear normal. Mood & affect appropriate.   Data Reviewed: I have personally reviewed following labs and imaging studies  CBC: Recent Labs  Lab 08/10/18 0905 08/11/18 0245 08/12/18 0307  WBC 12.3* 10.1 10.1  NEUTROABS  --  8.6* 7.8*  HGB 15.8 16.4 13.9  HCT 46.6 48.5 41.2  MCV 94.3 94.9 95.6  PLT 232 158 129*   Basic Metabolic Panel: Recent Labs  Lab 08/10/18 0905 08/10/18 1355 08/11/18 0245 08/12/18 0307  NA 139  --  139 134*  K 3.5  --  4.3 3.5  CL 104  --  100 97*  CO2 20*  --  29 26  GLUCOSE 144*  --  108* 81  BUN 7  --  5* <5*  CREATININE 0.89  --  0.88 0.96  CALCIUM 9.1  --  8.9 8.2*  MG  --  1.7  --   --   PHOS  --  3.7  --   --    GFR: Estimated Creatinine Clearance: 111.9 mL/min (by C-G formula based on SCr of 0.96 mg/dL). Liver Function Tests: Recent Labs  Lab 08/10/18  2263 08/11/18 0245  AST 90* 49*  ALT 45* 33  ALKPHOS 70 65  BILITOT 0.6 1.0  PROT 7.2 6.4*  ALBUMIN 3.9 3.4*   Recent Labs  Lab 08/10/18 0905 08/11/18 0245 08/12/18 0307  LIPASE 2,804* 905* 564*   No results for input(s): AMMONIA in the last 168 hours. Coagulation Profile: No results for input(s): INR, PROTIME in the last 168 hours. Cardiac Enzymes: No results for input(s): CKTOTAL, CKMB, CKMBINDEX, TROPONINI in the last 168 hours. BNP (last 3 results) No results for input(s): PROBNP in the last 8760 hours. HbA1C: Recent Labs    08/12/18 0307  HGBA1C 4.9   CBG: No results for input(s): GLUCAP in the last 168 hours. Lipid Profile: No results  for input(s): CHOL, HDL, LDLCALC, TRIG, CHOLHDL, LDLDIRECT in the last 72 hours. Thyroid Function Tests: No results for input(s): TSH, T4TOTAL, FREET4, T3FREE, THYROIDAB in the last 72 hours. Anemia Panel: No results for input(s): VITAMINB12, FOLATE, FERRITIN, TIBC, IRON, RETICCTPCT in the last 72 hours. Sepsis Labs: No results for input(s): PROCALCITON, LATICACIDVEN in the last 168 hours.  Recent Results (from the past 240 hour(s))  SARS Coronavirus 2 (CEPHEID - Performed in [facility] hospital lab), Hosp Order     Status: None   Collection Time: 08/10/18 12:15 PM  Result Value Ref Range Status   SARS Coronavirus 2 NEGATIVE NEGATIVE Final    Comment: (NOTE) If result is NEGATIVE SARS-CoV-2 target nucleic acids are NOT DETECTED. The SARS-CoV-2 RNA is generally detectable in upper and lower  respiratory specimens during the acute phase of infection. The lowest  concentration of SARS-CoV-2 viral copies this assay can detect is 250  copies / mL. A negative result does not preclude SARS-CoV-2 infection  and should not be used as the sole basis for treatment or other  patient management decisions.  A negative result may occur with  improper specimen collection / handling, submission of specimen other  than nasopharyngeal swab, presence of viral mutation(s) within the  areas targeted by this assay, and inadequate number of viral copies  (<250 copies / mL). A negative result must be combined with clinical  observations, patient history, and epidemiological information. If result is POSITIVE SARS-CoV-2 target nucleic acids are DETECTED. The SARS-CoV-2 RNA is generally detectable in upper and lower  respiratory specimens dur ing the acute phase of infection.  Positive  results are indicative of active infection with SARS-CoV-2.  Clinical  correlation with patient history and other diagnostic information is  necessary to determine patient infection status.  Positive results do  not rule out  bacterial infection or co-infection with other viruses. If result is PRESUMPTIVE POSTIVE SARS-CoV-2 nucleic acids MAY BE PRESENT.   A presumptive positive result was obtained on the submitted specimen  and confirmed on repeat testing.  While 2019 novel coronavirus  (SARS-CoV-2) nucleic acids may be present in the submitted sample  additional confirmatory testing may be necessary for epidemiological  and / or clinical management purposes  to differentiate between  SARS-CoV-2 and other Sarbecovirus currently known to infect humans.  If clinically indicated additional testing with an alternate test  methodology [phone]) is advised. The SARS-CoV-2 RNA is generally  detectable in upper and lower respiratory sp ecimens during the acute  phase of infection. The expected result is Negative. Fact Sheet for Patients:  BoilerBrush.com.cy Fact Sheet for Healthcare Providers: https://pope.com/ This test is not yet approved or cleared by the [place] FDA and has been authorized for detection and/or diagnosis of  SARS-CoV-2 by FDA under an Emergency Use Authorization (EUA).  This EUA will remain in effect (meaning this test can be used) for the duration of the COVID-19 declaration under Section 564(b)(1) of the Act, 21 U.S.C. section 360bbb-3(b)(1), unless the authorization is terminated or revoked sooner. Performed at [facility], [address] [place], [place] [address]      Radiology Studies: No results found.  Scheduled Meds: . chlordiazePOXIDE  25 mg Oral TID   Followed by  . [START ON 08/13/2018] chlordiazePOXIDE  25 mg Oral BH-qamhs   Followed by  . [START ON 08/14/2018] chlordiazePOXIDE  25 mg Oral Daily  . docusate sodium  100 mg Oral Daily  . heparin injection (subcutaneous)  5,000 Units Subcutaneous Q8H  . metoprolol tartrate  25 mg Oral BID  . nicotine  21 mg Transdermal Daily  . pantoprazole (PROTONIX) IV  40 mg  Intravenous Q24H   Continuous Infusions: . 0.9 % NaCl with KCl 20 mEq / L 100 mL/hr at 08/12/18 0448     LOS: 2 days   Rickey BarbaraStephen Chiu, MD [facility] Pager On Amion  If 7PM-7AM, please contact night-coverage 08/12/2018, 3:23 PM

## 2018-08-19 NOTE — Progress Notes (Deleted)
Patient ID: Makoto Sellitto, male   DOB: [birth year], 33 y.o.   MRN: [id number]  After hospitalization for pancreatitis 6/01-6/05/2018.    From discharge summary: Brief/Interim Summary: 32 year old male with past medical history of alcohol dependency and tobacco abuse; presented to the hospital secondary to epigastric pain, nausea and 2 episodes of emesis. Patient found with acute alcoholic pancreatitis.  Discharge Diagnoses:  Principal Problem:   Acute pancreatitis Active Problems:   Alcohol dependence ([place])   Tobacco use   Hyperglycemia   1-acutealcoholicpancreatitis -No vomiting -On re-evaluation, patient reports feeling much better, eager to go home -Lipase trending down -Continue as needed antiemetics and analgesics. -Advance diet as tolerated  2-Alcohol dependence ([place]) -Cessation counseling has been provided -Patient interested in detox, recommend ETOH treatment program as outpatient  3-Tobacco use -Tobacco cessation was done earlier -Continued on nicotine patchwhile in hospital  4-Hyperglycemia -no prior hx of DM -Glucose trends stable at this time -A1Cof 4.9

## 2018-08-20 ENCOUNTER — Inpatient Hospital Stay: Payer: Self-pay

## 2018-09-09 NOTE — Progress Notes (Signed)
Patient ID: Isaac Alvarado, male   DOB: 06/18/1985, 33 y.o.   MRN: [id number]    Isaac Alvarado, is a 33 y.o. male  [id number]  [id number]  DOB - 05/18/1985  Subjective:  Chief Complaint and HPI: Isaac Alvarado is a 33 y.o. male here today to establish care and for a follow up visit After hospitalization 6/1-08/12/2018 for epigastric pain, N/V.  He has not had further episodes.  He was advised to abstain from alcohol but has still been drinking about 6 beers a few nights a week.    Brief/Interim Summary: 33 year old male with past medical history of alcohol dependency and tobacco abuse; presented to the hospital secondary to epigastric pain, nausea and 2 episodes of emesis. Patient found with acute alcoholic pancreatitis.  Discharge Diagnoses:  Principal Problem:   Acute pancreatitis Active Problems:   Alcohol dependence (HCC)   Tobacco use   Hyperglycemia   1-acutealcoholicpancreatitis -No vomiting -On re-evaluation, patient reports feeling much better, eager to go home -Lipase trending down -Continue as needed antiemetics and analgesics. -Advance diet as tolerated  2-Alcohol dependence (HCC) -Cessation counseling has been provided -Patient interested in detox, recommend ETOH treatment program as outpatient  3-Tobacco use -Tobacco cessation was done earlier -Continued on nicotine patchwhile in hospital  4-Hyperglycemia -no prior hx of DM -Glucose trends stable at this time -A1Cof 4.9  ED/Hospital notes reviewed.   Social History: Family history:  ROS:   Constitutional:  No f/c, No night sweats, No unexplained weight loss. EENT:  No vision changes, No blurry vision, No hearing changes. No mouth, throat, or ear problems.  Respiratory: No cough, No SOB Cardiac: No CP, no palpitations GI:  No abd pain, No N/V/D. GU: No Urinary s/sx Musculoskeletal: No joint pain Neuro: No headache, no dizziness, no motor weakness.  Skin: No rash Endocrine:  No polydipsia.  No polyuria.  Psych: Denies SI/HI  No problems updated.  ALLERGIES: No Known Allergies  PAST MEDICAL HISTORY: Past Medical History:  Diagnosis Date  . Alcohol dependence (HCC)   . Tobacco use     MEDICATIONS AT HOME: Prior to Admission medications   Medication Sig Start Date End Date Taking? Authorizing Provider  diphenhydrAMINE (BENADRYL) 25 mg capsule Take 25 mg by mouth daily at 12 noon.   Yes [provider]  metoprolol tartrate (LOPRESSOR) 25 MG tablet Take 1 tablet (25 mg total) by mouth 2 (two) times daily. 09/10/18 10/10/18 Yes Mekaela Azizi, Marzella SchleinAngela M, PA-C  omeprazole (PRILOSEC OTC) 20 MG tablet Take 20 mg by mouth daily at 12 noon.   Yes [provider]  ibuprofen (ADVIL) 800 MG tablet Take 1 tablet (800 mg total) by mouth every 8 (eight) hours as needed. Patient not taking: Reported on 09/10/2018 08/12/18   Jerald Kiefhiu, Stephen K, MD     Objective:  EXAM:   Vitals:   09/10/18 1432  BP: 114/77  Pulse: 91  Temp: 97.8 F (36.6 C)  TempSrc: Oral  SpO2: 99%  Weight: 164 lb 12.8 oz (74.8 kg)  Height: 5\' 11"  (1.803 m)    General appearance : A&OX3. NAD. Non-toxic-appearing HEENT: Atraumatic and Normocephalic.  PERRLA. EOM intact.  Neck: supple, no JVD. No cervical lymphadenopathy. No thyromegaly Chest/Lungs:  Breathing-non-labored, Good air entry bilaterally, breath sounds normal without rales, rhonchi, or wheezing  CVS: S1 S2 regular, no murmurs, gallops, rubs  Abdomen: Bowel sounds present, Non tender and not distended with no gaurding, rigidity or rebound. Neurology:  CN II-XII grossly intact, Non focal.   Psych:  TP linear. J/I WNL. Normal speech. Appropriate eye contact and affect.  Skin:  No Rash  Data Review Lab Results  Component Value Date   HGBA1C 4.9 08/12/2018     Assessment & Plan   1. Alcohol-induced acute pancreatitis, unspecified complication status AA meeting schedule including zoom mtg info given I have counseled the patient at length  about substance abuse and addiction.  12 step meetings/recovery recommended.  Local 12 step meeting lists were given and attendance was encouraged.  Patient expresses understanding.  - Lipase  2. Uncomplicated alcohol dependence ([place]) I have counseled the patient at length about substance abuse and addiction.  12 step meetings/recovery recommended.  Local 12 step meeting lists were given and attendance was encouraged.  Patient expresses understanding.    3. Elevated LFTs - Comprehensive metabolic panel - CBC with Differential/Platelet  4. Hypertension, unspecified type Controlled-continue - metoprolol tartrate (LOPRESSOR) 25 MG tablet; Take 1 tablet (25 mg total) by mouth 2 (two) times daily.  Dispense: 180 tablet; Refill: 1  5. Hospital discharge follow-up Much improved     Patient have been counseled extensively about nutrition and exercise  Return in about 1 month (around 10/11/2018) for assign PCP.  The patient was given clear instructions to go to ER or return to medical center if symptoms don't improve, worsen or new problems develop. The patient verbalized understanding. The patient was told to call to get lab results if they haven't heard anything in the next week.     Freeman Caldron, PA-C [facility] and [facility] [place], [place]   09/10/2018, 3:14 PM

## 2018-09-10 ENCOUNTER — Ambulatory Visit: Payer: Self-pay | Attending: Family Medicine | Admitting: Physician Assistant

## 2018-09-10 ENCOUNTER — Other Ambulatory Visit: Payer: Self-pay

## 2018-09-10 VITALS — BP 114/77 | HR 91 | Temp 97.8°F | Ht 71.0 in | Wt 164.8 lb

## 2018-09-10 DIAGNOSIS — Z09 Encounter for follow-up examination after completed treatment for conditions other than malignant neoplasm: Secondary | ICD-10-CM

## 2018-09-10 DIAGNOSIS — R945 Abnormal results of liver function studies: Secondary | ICD-10-CM

## 2018-09-10 DIAGNOSIS — F1020 Alcohol dependence, uncomplicated: Secondary | ICD-10-CM

## 2018-09-10 DIAGNOSIS — R7989 Other specified abnormal findings of blood chemistry: Secondary | ICD-10-CM

## 2018-09-10 DIAGNOSIS — K852 Alcohol induced acute pancreatitis without necrosis or infection: Secondary | ICD-10-CM

## 2018-09-10 DIAGNOSIS — I10 Essential (primary) hypertension: Secondary | ICD-10-CM

## 2018-09-10 MED ORDER — METOPROLOL TARTRATE 25 MG PO TABS: 25.0000 mg | ORAL_TABLET | Freq: Two times a day (BID) | ORAL | 1 refills | Status: DC

## 2018-09-10 MED FILL — METOPROLOL TARTRATE 25 MG T: 25 | 30 days supply | Qty: 60 | Fill #0

## 2018-09-10 NOTE — Patient Instructions (Addendum)
ListRules.co.uk for AA meetings  Alcohol abstinence is imperative for you

## 2018-09-11 LAB — COMPREHENSIVE METABOLIC PANEL
ALT: 27 IU/L (ref 0–44)
AST: 32 IU/L (ref 0–40)
Albumin/Globulin Ratio: 1.8 (ref 1.2–2.2)
Albumin: 4.2 g/dL (ref 4.1–5.2)
Alkaline Phosphatase: 86 IU/L (ref 39–117)
BUN/Creatinine Ratio: 11 (ref 9–20)
BUN: 9 mg/dL (ref 6–20)
Bilirubin Total: <0.2 mg/dL (ref 0.0–1.2)
CO2: 26 mmol/L (ref 20–29)
Calcium: 9.2 mg/dL (ref 8.7–10.2)
Chloride: 101 mmol/L (ref 96–106)
Creatinine, Ser: 0.82 mg/dL (ref 0.76–1.27)
GFR calc Af Amer: 137 mL/min/{1.73_m2} (ref >59)
GFR calc non Af Amer: 119 mL/min/{1.73_m2} (ref >59)
Globulin, Total: 2.4 g/dL (ref 1.5–4.5)
Glucose: 93 mg/dL (ref 65–99)
Potassium: 4.1 mmol/L (ref 3.5–5.2)
Sodium: 140 mmol/L (ref 134–144)
Total Protein: 6.6 g/dL (ref 6.0–8.5)

## 2018-09-11 LAB — CBC WITH DIFFERENTIAL/PLATELET
Basophils Absolute: 0.1 10*3/uL (ref 0.0–0.2)
Basos: 1 %
EOS (ABSOLUTE): 0.7 10*3/uL — ABNORMAL HIGH (ref 0.0–0.4)
Eos: 8 %
Hematocrit: 43.1 % (ref 37.5–51.0)
Hemoglobin: 14.5 g/dL (ref 13.0–17.7)
Immature Grans (Abs): 0 10*3/uL (ref 0.0–0.1)
Immature Granulocytes: 0 %
Lymphocytes Absolute: 3.3 10*3/uL — ABNORMAL HIGH (ref 0.7–3.1)
Lymphs: 38 %
MCH: 31.2 pg (ref 26.6–33.0)
MCHC: 33.6 g/dL (ref 31.5–35.7)
MCV: 93 fL (ref 79–97)
Monocytes Absolute: 0.8 10*3/uL (ref 0.1–0.9)
Monocytes: 9 %
Neutrophils Absolute: 3.8 10*3/uL (ref 1.4–7.0)
Neutrophils: 44 %
Platelets: 255 10*3/uL (ref 150–450)
RBC: 4.65 x10E6/uL (ref 4.14–5.80)
RDW: 12.4 % (ref 11.6–15.4)
WBC: 8.7 10*3/uL (ref 3.4–10.8)

## 2018-09-11 LAB — LIPASE: Lipase: 34 U/L (ref 13–78)

## 2018-09-25 ENCOUNTER — Telehealth: Payer: Self-pay | Admitting: General Practice

## 2018-09-25 NOTE — Telephone Encounter (Signed)
-----   Message from [place], [place] sent at 09/22/2018  4:59 PM EDT ----- Please schedule patient Return in about 1 month (around 10/11/2018) for assign PCP.

## 2018-09-25 NOTE — Telephone Encounter (Signed)
Attempted to reach patient to schedule appt no answer unable to lvm

## 2018-10-19 MED FILL — METOPROLOL TARTRATE 25 MG T: 25 | 30 days supply | Qty: 60 | Fill #1

## 2018-11-17 MED FILL — METOPROLOL TARTRATE 25 MG T: 25 | 30 days supply | Qty: 60 | Fill #2

## 2019-01-06 ENCOUNTER — Ambulatory Visit: Payer: Self-pay | Admitting: Family Medicine

## 2019-01-18 MED FILL — METOPROLOL TARTRATE 25 MG T: 25 | 30 days supply | Qty: 60 | Fill #4

## 2019-02-15 MED FILL — METOPROLOL TARTRATE 25 MG T: 25 | 30 days supply | Qty: 60 | Fill #5

## 2019-03-15 ENCOUNTER — Other Ambulatory Visit: Payer: Self-pay | Admitting: Physician Assistant

## 2019-03-15 DIAGNOSIS — I10 Essential (primary) hypertension: Secondary | ICD-10-CM

## 2019-03-16 ENCOUNTER — Other Ambulatory Visit: Payer: Self-pay | Admitting: Pharmacist

## 2019-03-16 DIAGNOSIS — I10 Essential (primary) hypertension: Secondary | ICD-10-CM

## 2019-03-16 NOTE — Telephone Encounter (Signed)
Patient called requesting metoprolol refill. He is moving to [place] and re-establishing with a new PCP.   He was last seen in clinic 09/2018. He never returned for PCP assignment. Will send to last provider who saw him to see if she can fill.

## 2019-03-17 MED ORDER — METOPROLOL TARTRATE 25 MG PO TABS: 25.0000 mg | ORAL_TABLET | Freq: Two times a day (BID) | ORAL | 1 refills | Status: DC

## 2019-09-09 ENCOUNTER — Other Ambulatory Visit: Payer: Self-pay | Admitting: Physician Assistant

## 2019-09-09 DIAGNOSIS — I10 Essential (primary) hypertension: Secondary | ICD-10-CM

## 2019-09-09 NOTE — Telephone Encounter (Signed)
Requested  medications are  due for refill today yes  Requested medications are on the active medication list yes  Last refill 4/9  Future visit scheduled no  Last visit July 2020  Notes to clinic Has no PCP listed, failed protocol due to visit more than 6 months ago

## 2019-10-26 ENCOUNTER — Ambulatory Visit: Payer: Self-pay | Admitting: Family Medicine

## 2019-12-14 ENCOUNTER — Other Ambulatory Visit: Payer: Self-pay | Admitting: Family Medicine

## 2019-12-14 DIAGNOSIS — I10 Essential (primary) hypertension: Secondary | ICD-10-CM

## 2019-12-14 NOTE — Telephone Encounter (Signed)
Requested medications are due for refill today? Yes   Requested medications are on active medication list? Yes  Last Refill:  09/16/2019  # 180 with no refills   Future visit scheduled?  No   Notes to Clinic:  Medication failed Rx refill protocol due to no valid encounter in the past six months.    This request is for from a [facility] in [place].  Patient was a no show for his appointment on 10/26/2019 and the previous appointment on 01/06/2019.  Last office visit was on 09/10/2018.

## 2021-03-02 IMAGING — DX PORTABLE CHEST - 1 VIEW
1 series · 1 of 1 positions shown · non-contrast
Comparison: None.

CLINICAL DATA: Pain

EXAM:
PORTABLE CHEST 1 VIEW

[chest ap]
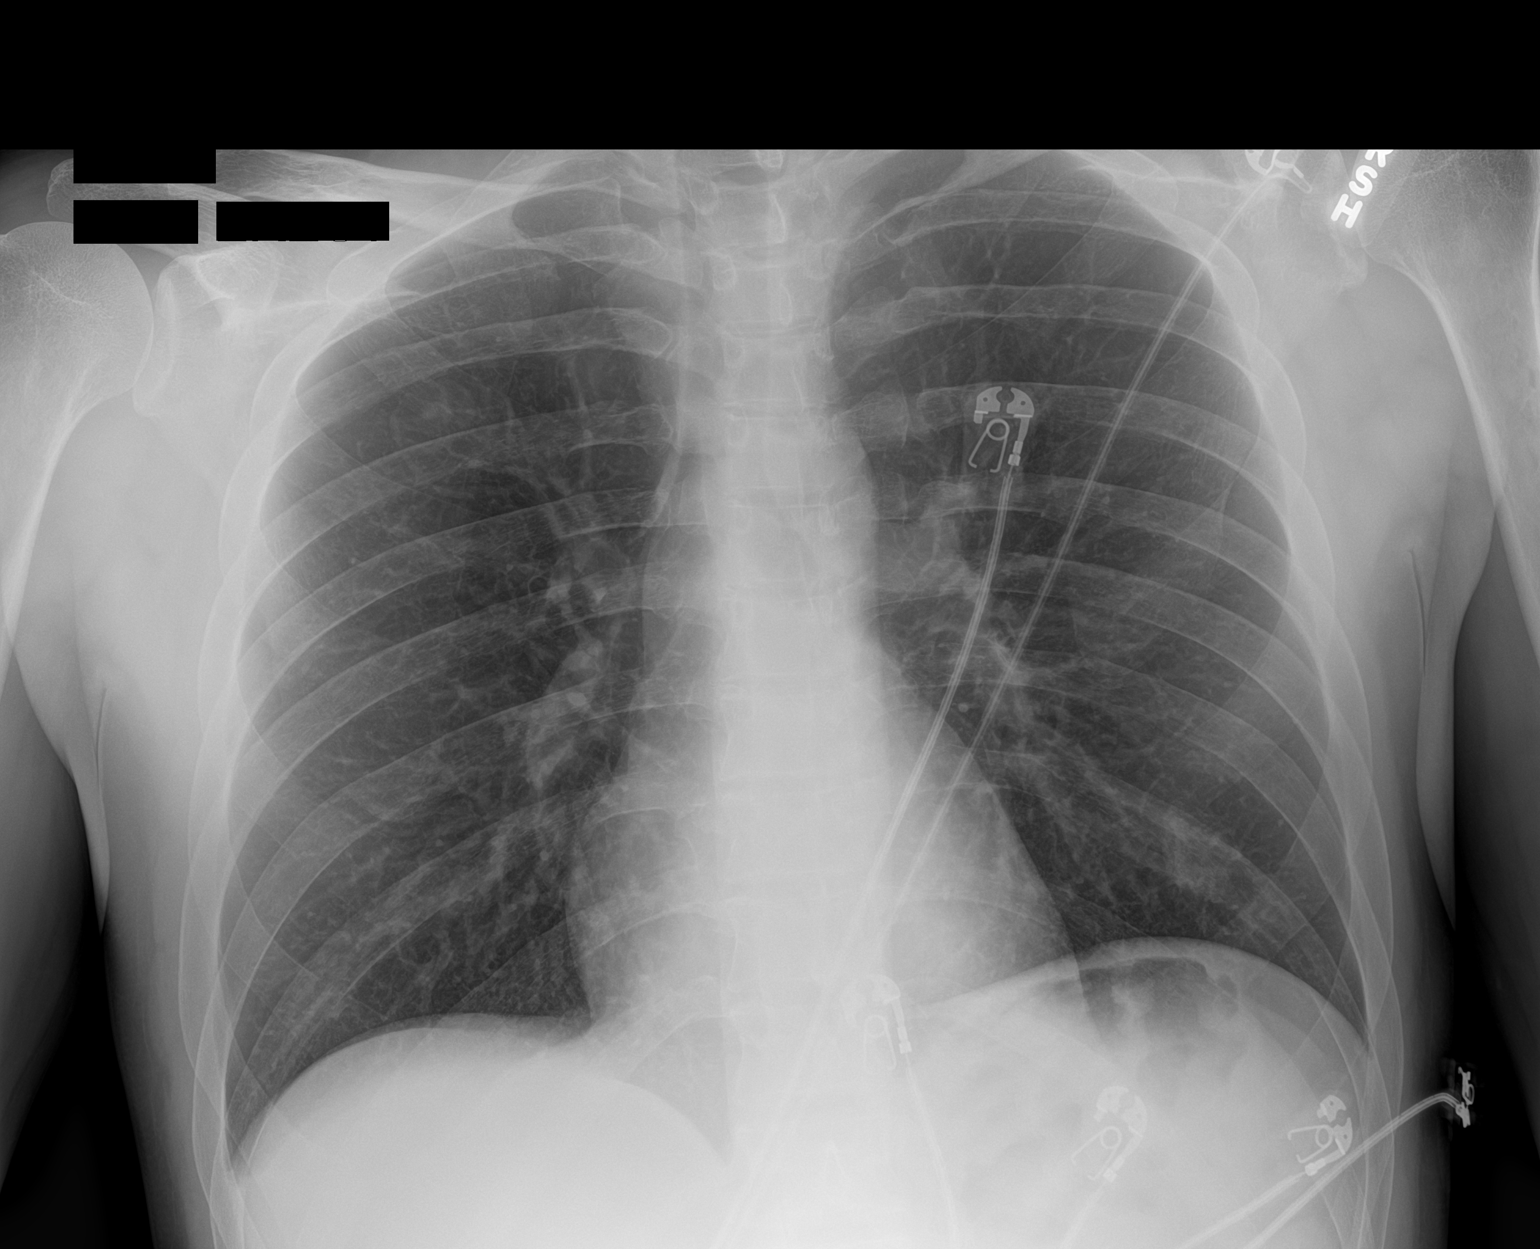

[1 of 1 positions shown; findings below may reference images not displayed]

FINDINGS: Lungs are clear. Heart size and pulmonary vascularity are normal. No
adenopathy. No pneumothorax. No bone lesions.
IMPRESSION: No edema or consolidation.

## 2021-03-02 IMAGING — CT CT ABDOMEN AND PELVIS WITH CONTRAST
2 of 4 series · 16 of 46 positions shown, 18 images · IV contrast (omnipaque)
Comparison: None.

CLINICAL DATA: Diffuse abdominal pain, nausea, unable to lie on
back

EXAM:
CT ABDOMEN AND PELVIS WITH CONTRAST
TECHNIQUE: Multidetector CT imaging of the abdomen and pelvis was performed
using the standard protocol following bolus administration of
intravenous contrast.
CONTRAST:  100mL OMNIPAQUE IOHEXOL 300 MG/ML  SOLN

[Series 3: a/p w/ 5mm · axial · 0.72mm/px · z∈[+600,+1040]mm · 13 of 98 slices shown, 15 images]
[im 5/98  soft-tissue]
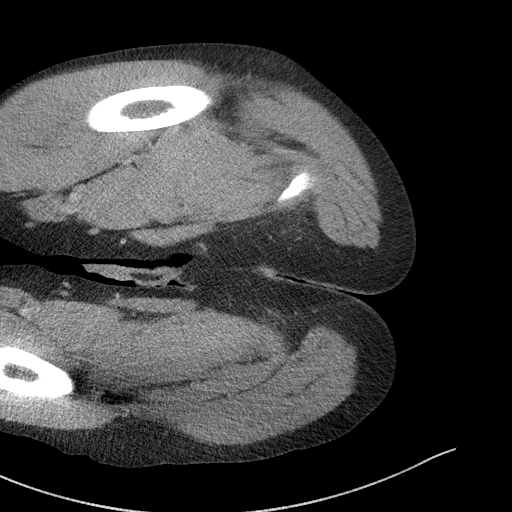
[im 5/98  bone]
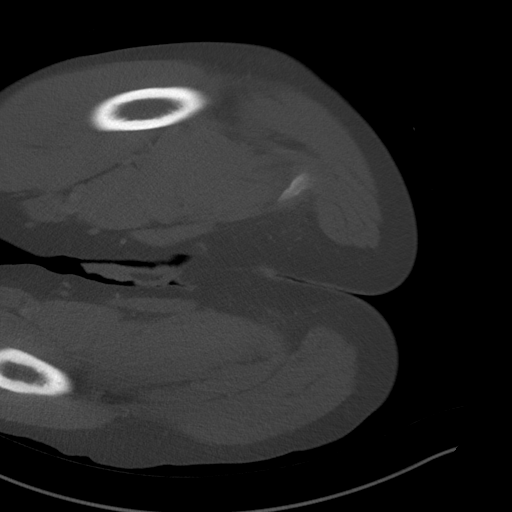
[im 13/98  soft-tissue]
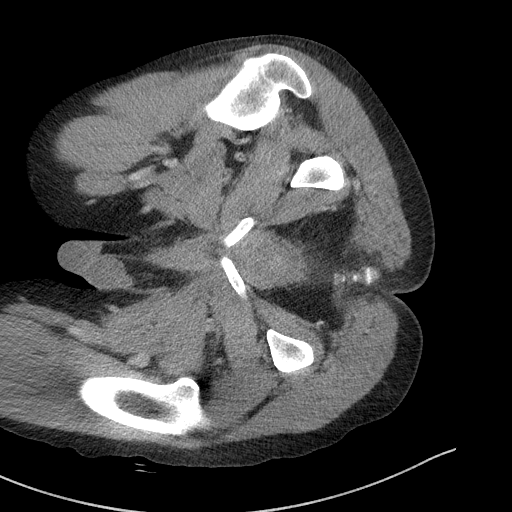
[im 22/98  soft-tissue]
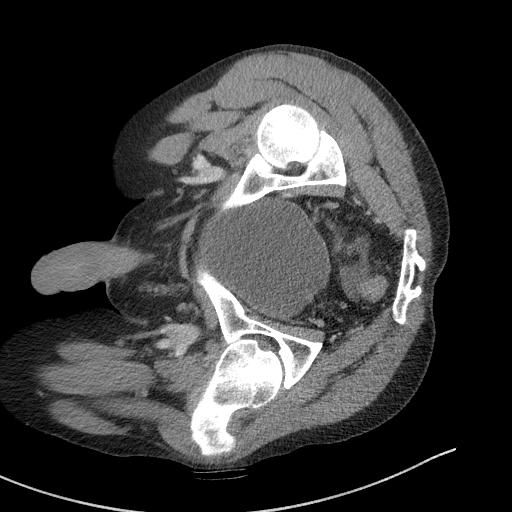
[im 26/98  soft-tissue]
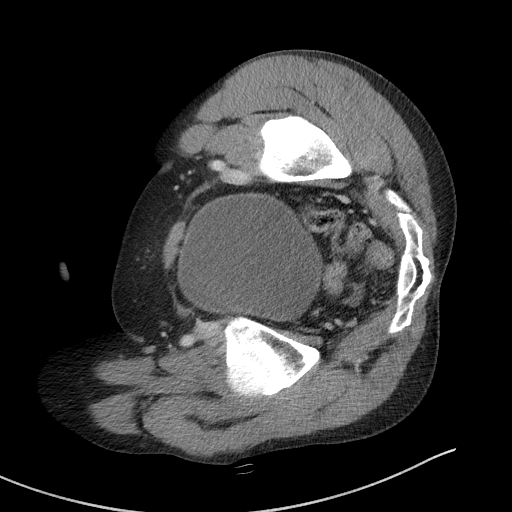
[im 34/98  soft-tissue]
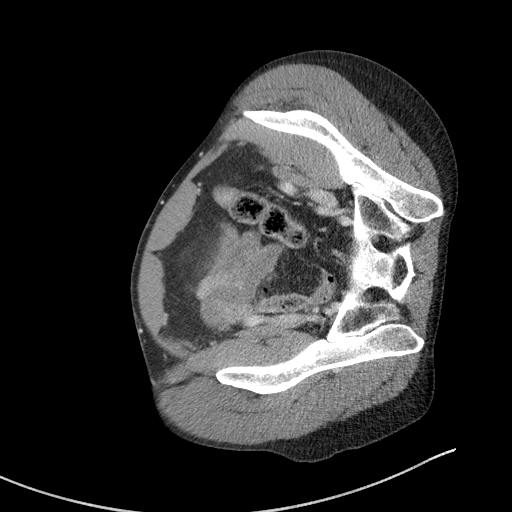
[im 43/98  soft-tissue]
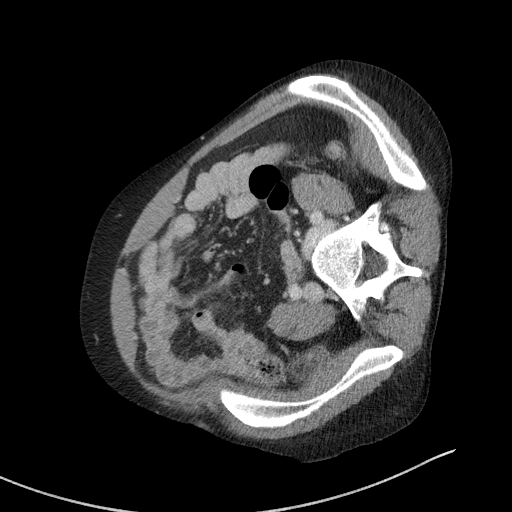
[im 51/98  soft-tissue]
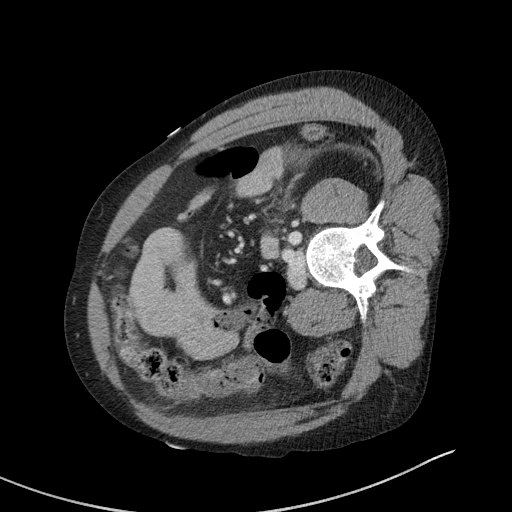
[im 55/98  soft-tissue]
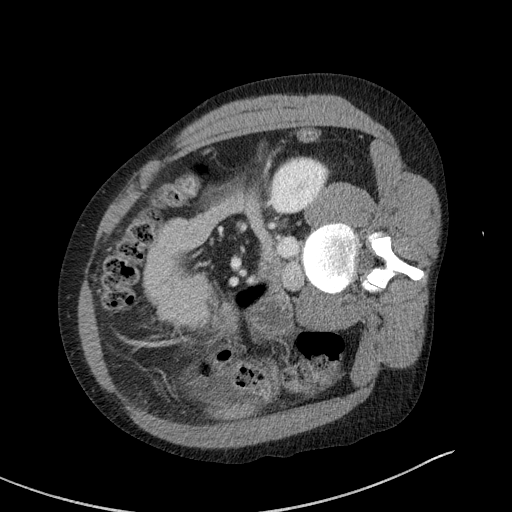
[im 64/98  soft-tissue]
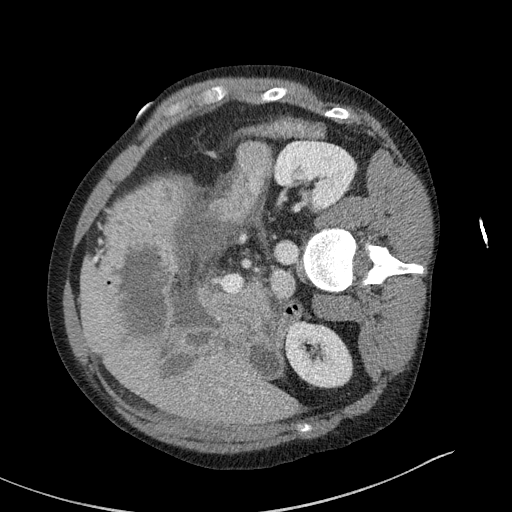
[im 64/98  bone]
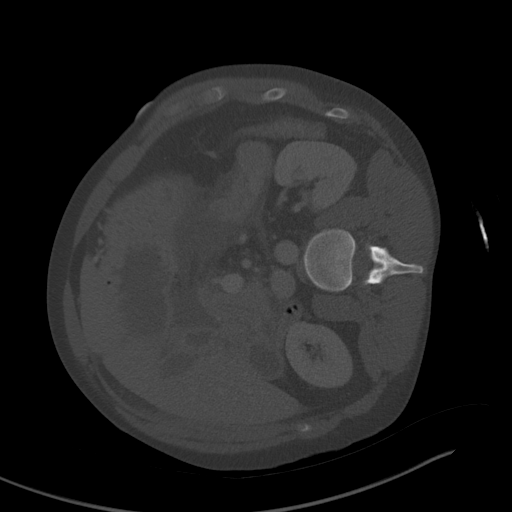
[im 72/98  soft-tissue]
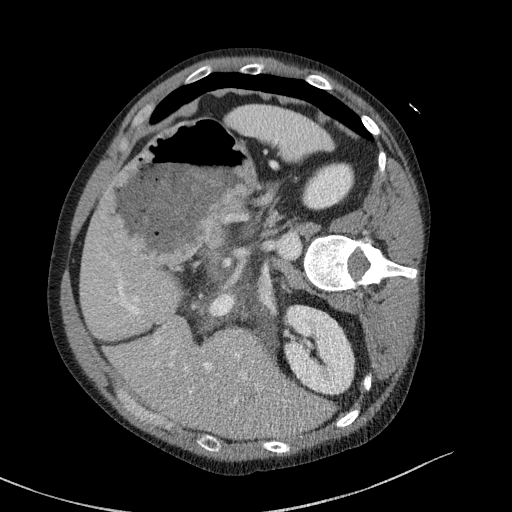
[im 76/98  soft-tissue]
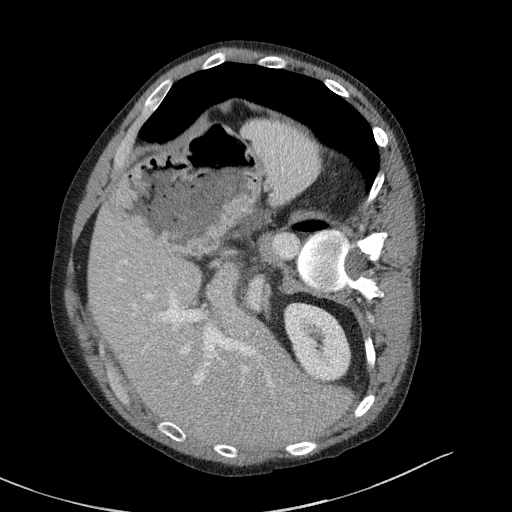
[im 85/98  soft-tissue]
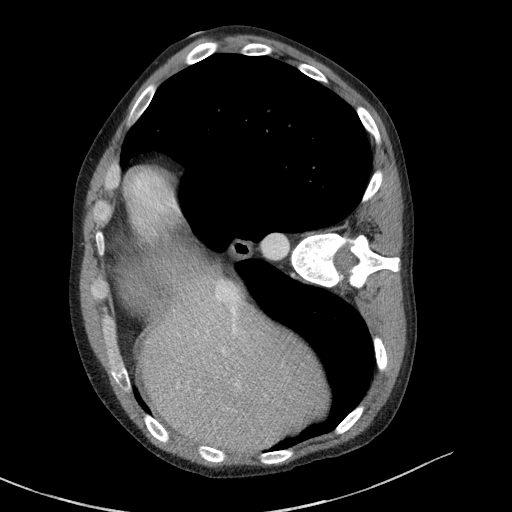
[im 93/98  soft-tissue]
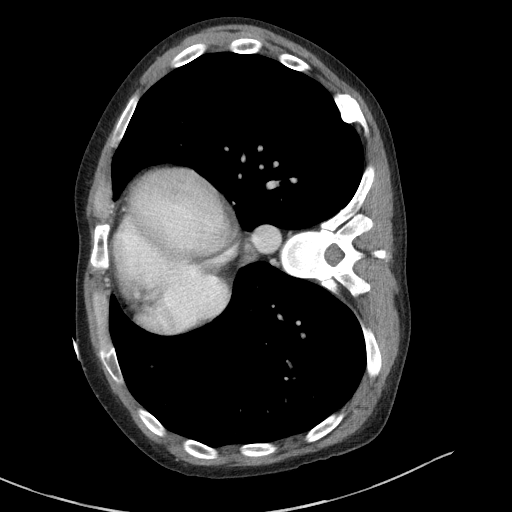

[Series 6: a/p w/ cor · coronal · 0.80mm/px · 3 of 143 slices shown]
[im 48/143  soft-tissue]
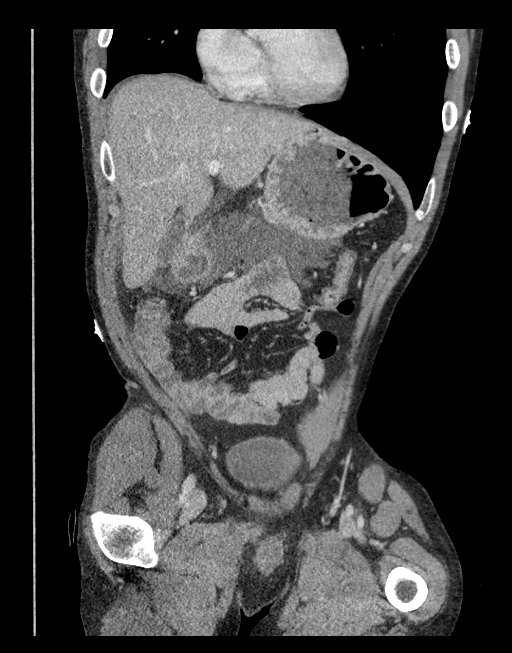
[im 64/143  soft-tissue]
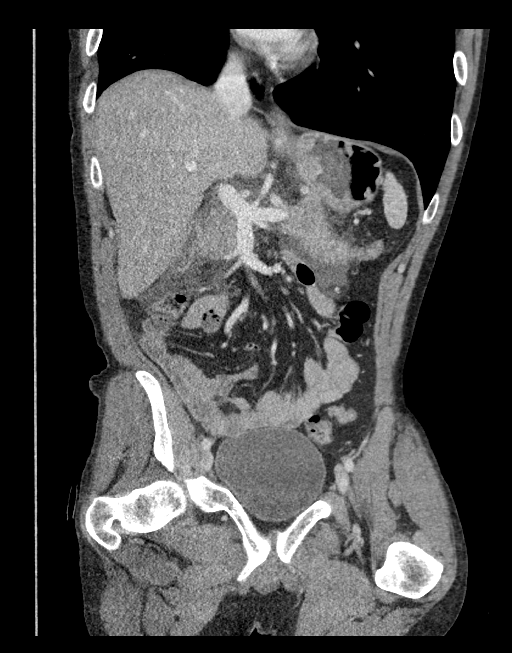
[im 79/143  soft-tissue]
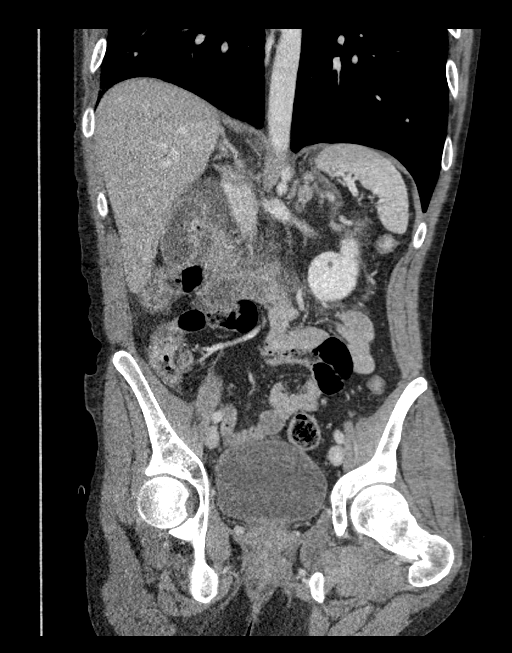

[16 of 46 positions shown; findings below may reference images not displayed]

FINDINGS: Lower chest: No acute abnormality.

Hepatobiliary: No focal liver abnormality is seen. No gallstones,
gallbladder wall thickening, or biliary dilatation.

Pancreas: There is severe, diffuse inflammatory fat stranding and
fluid about the pancreas and adjacent retroperitoneum and bowel
mesentery. No evidence of discrete fluid collection. No pancreatic
ductal dilatation.

Spleen: Normal in size without focal abnormality.

Adrenals/Urinary Tract: Adrenal glands are unremarkable. Kidneys are
normal, without renal calculi, focal lesion, or hydronephrosis.
Bladder is unremarkable.

Stomach/Bowel: Stomach is within normal limits. Appendix appears
normal. No evidence of bowel wall thickening, distention, or
inflammatory changes.

Vascular/Lymphatic: No significant vascular findings are present. No
enlarged abdominal or pelvic lymph nodes.

Reproductive: No mass or other abnormality.

Other: No abdominal wall hernia or abnormality.  Minimal ascites.

Musculoskeletal: No acute or significant osseous findings.
IMPRESSION: 1. There is severe, diffuse inflammatory fat stranding and fluid
about the pancreas and adjacent retroperitoneum and bowel mesentery.
Findings are consistent with acute pancreatitis. No evidence of
discrete fluid collection. No pancreatic ductal dilatation.

2.  Minimal ascites.
